# Patient Record
Sex: Female | Born: 1965
Health system: Southern US, Community
[De-identification: ages and names within clinical notes are randomized; demographics above are authoritative.]

## PROBLEM LIST (undated history)

## (undated) DIAGNOSIS — I10 Essential (primary) hypertension: Secondary | ICD-10-CM

## (undated) DIAGNOSIS — E669 Obesity, unspecified: Secondary | ICD-10-CM

## (undated) DIAGNOSIS — G4730 Sleep apnea, unspecified: Secondary | ICD-10-CM

## (undated) HISTORY — DX: Obesity, unspecified: E66.9

## (undated) HISTORY — DX: Sleep apnea, unspecified: G47.30

---

## 2001-07-27 ENCOUNTER — Emergency Department (HOSPITAL_COMMUNITY): Admission: EM | Admit: 2001-07-27 | Discharge: 2001-07-27 | Payer: Self-pay | Admitting: Emergency Medicine

## 2001-07-30 ENCOUNTER — Encounter: Payer: Self-pay | Admitting: Family Medicine

## 2001-07-30 ENCOUNTER — Ambulatory Visit (HOSPITAL_COMMUNITY): Admission: RE | Admit: 2001-07-30 | Discharge: 2001-07-30 | Payer: Self-pay | Admitting: Family Medicine

## 2001-08-06 ENCOUNTER — Other Ambulatory Visit: Admission: RE | Admit: 2001-08-06 | Discharge: 2001-08-06 | Payer: Self-pay | Admitting: Obstetrics and Gynecology

## 2003-08-02 ENCOUNTER — Emergency Department (HOSPITAL_COMMUNITY): Admission: EM | Admit: 2003-08-02 | Discharge: 2003-08-02 | Payer: Self-pay | Admitting: Emergency Medicine

## 2003-08-02 ENCOUNTER — Encounter: Payer: Self-pay | Admitting: Emergency Medicine

## 2005-08-11 ENCOUNTER — Emergency Department (HOSPITAL_COMMUNITY): Admission: EM | Admit: 2005-08-11 | Discharge: 2005-08-11 | Payer: Self-pay | Admitting: Family Medicine

## 2009-02-26 ENCOUNTER — Encounter: Admission: RE | Admit: 2009-02-26 | Discharge: 2009-02-26 | Payer: Self-pay | Admitting: Family Medicine

## 2010-11-09 ENCOUNTER — Encounter: Payer: Self-pay | Admitting: Family Medicine

## 2012-12-07 ENCOUNTER — Emergency Department (INDEPENDENT_AMBULATORY_CARE_PROVIDER_SITE_OTHER): Payer: BC Managed Care – PPO

## 2012-12-07 ENCOUNTER — Encounter (HOSPITAL_COMMUNITY): Payer: Self-pay | Admitting: *Deleted

## 2012-12-07 ENCOUNTER — Emergency Department (HOSPITAL_COMMUNITY)
Admission: EM | Admit: 2012-12-07 | Discharge: 2012-12-07 | Disposition: A | Discharge status: Home / Self Care | Payer: BC Managed Care – PPO | Source: Home / Self Care | Attending: Family Medicine | Admitting: Family Medicine

## 2012-12-07 DIAGNOSIS — M7552 Bursitis of left shoulder: Secondary | ICD-10-CM

## 2012-12-07 DIAGNOSIS — M719 Bursopathy, unspecified: Secondary | ICD-10-CM

## 2012-12-07 DIAGNOSIS — M67919 Unspecified disorder of synovium and tendon, unspecified shoulder: Secondary | ICD-10-CM

## 2012-12-07 MED ORDER — DICLOFENAC POTASSIUM 50 MG PO TABS: 50.0000 mg | ORAL_TABLET | Freq: Three times a day (TID) | ORAL | Status: DC

## 2012-12-07 NOTE — ED Notes (Signed)
C/o L  Shoulder pain onset 1 week ago.  No known injury.  Noted swelling L upper arm 3 days ago.  Feels  pulling in the L side of her neck.  Has pain in her L breast Friday night.

## 2012-12-07 NOTE — ED Provider Notes (Signed)
History     CSN: [id number]  Arrival date & time 12/07/12  1453   First MD Initiated Contact with Patient 12/07/12 1505      Chief Complaint  Patient presents with  . Shoulder Pain    (Consider location/radiation/quality/duration/timing/severity/associated sxs/prior treatment) Patient is a 47 y.o. female presenting with shoulder pain. The history is provided by the patient.  Shoulder Pain This is a new problem. The current episode started more than 1 week ago. The problem has been gradually worsening.    History reviewed. No pertinent past medical history.  History reviewed. No pertinent past surgical history.  Family History  Problem Relation Age of Onset  . Hypertension Mother   . Heart disease Mother   . Cirrhosis Father   . Breast cancer Other     History  Substance Use Topics  . Smoking status: Never Smoker   . Smokeless tobacco: Not on file  . Alcohol Use: Yes     Comment: occasional    OB History   Grav Para Term Preterm Abortions TAB SAB Ect Mult Living                  Review of Systems  Constitutional: Negative.   Musculoskeletal: Positive for joint swelling.  Skin: Negative.     Allergies  Review of patient's allergies indicates no known allergies.  Home Medications   Current Outpatient Rx  Name  Route  Sig  Dispense  Refill  . diclofenac (CATAFLAM) 50 MG tablet   Oral   Take 1 tablet (50 mg total) by mouth 3 (three) times daily.   30 tablet   0     BP 154/75  Pulse 93  Temp(Src) 97.8 F (36.6 C) (Oral)  Resp 16  SpO2 100%  Physical Exam  Nursing note and vitals reviewed. Constitutional: She is oriented to person, place, and time. She appears well-developed and well-nourished.  Musculoskeletal: She exhibits tenderness.       Left shoulder: She exhibits tenderness and pain. She exhibits no swelling, no effusion, no deformity, no spasm, normal pulse and normal strength.  Neurological: She is alert and oriented to person, place,  and time.  Skin: Skin is warm and dry.    ED Course  Procedures (including critical care time)  Labs Reviewed - No data to display Dg Shoulder Left  12/07/2012  *RADIOLOGY REPORT*  Clinical Data: Pain.  No injury.  LEFT SHOULDER - 2+ VIEW  Comparison: None  Findings: Mild degenerative changes in the left AC joint. Glenohumeral joint is intact. No acute bony abnormality. Specifically, no fracture, subluxation, or dislocation.  Soft tissues are intact.  IMPRESSION: No acute bony abnormality.   Original Report Authenticated By: Charlett Nose, M.D.      1. Bursitis of deltoid, left       MDM  X-rays reviewed and report per radiologist.         Linna Hoff, MD 12/07/12 2010

## 2013-07-06 ENCOUNTER — Ambulatory Visit (INDEPENDENT_AMBULATORY_CARE_PROVIDER_SITE_OTHER): Payer: Self-pay | Admitting: General Surgery

## 2013-07-18 ENCOUNTER — Encounter (INDEPENDENT_AMBULATORY_CARE_PROVIDER_SITE_OTHER): Payer: Self-pay | Admitting: General Surgery

## 2014-01-02 ENCOUNTER — Other Ambulatory Visit (HOSPITAL_COMMUNITY): Payer: Self-pay | Admitting: Nurse Practitioner

## 2014-01-02 DIAGNOSIS — Z1231 Encounter for screening mammogram for malignant neoplasm of breast: Secondary | ICD-10-CM

## 2014-01-06 ENCOUNTER — Ambulatory Visit (HOSPITAL_COMMUNITY): Payer: BC Managed Care – PPO

## 2014-01-17 ENCOUNTER — Ambulatory Visit (HOSPITAL_COMMUNITY)
Admission: RE | Admit: 2014-01-17 | Discharge: 2014-01-17 | Disposition: A | Discharge status: Home / Self Care | Payer: BC Managed Care – PPO | Source: Ambulatory Visit | Attending: Nurse Practitioner | Admitting: Nurse Practitioner

## 2014-01-17 DIAGNOSIS — Z1231 Encounter for screening mammogram for malignant neoplasm of breast: Secondary | ICD-10-CM | POA: Insufficient documentation

## 2014-01-19 ENCOUNTER — Other Ambulatory Visit: Payer: Self-pay | Admitting: Nurse Practitioner

## 2014-01-19 DIAGNOSIS — R928 Other abnormal and inconclusive findings on diagnostic imaging of breast: Secondary | ICD-10-CM

## 2014-01-31 ENCOUNTER — Ambulatory Visit
Admission: RE | Admit: 2014-01-31 | Discharge: 2014-01-31 | Disposition: A | Discharge status: Home / Self Care | Payer: BC Managed Care – PPO | Source: Ambulatory Visit | Attending: Nurse Practitioner | Admitting: Nurse Practitioner

## 2014-01-31 DIAGNOSIS — R928 Other abnormal and inconclusive findings on diagnostic imaging of breast: Secondary | ICD-10-CM

## 2014-06-30 ENCOUNTER — Telehealth (HOSPITAL_COMMUNITY): Payer: Self-pay | Admitting: *Deleted

## 2014-06-30 NOTE — Telephone Encounter (Signed)
Telephoned patient at home # and left message to return call to [facility] 

## 2014-08-17 ENCOUNTER — Other Ambulatory Visit (HOSPITAL_COMMUNITY): Payer: Self-pay | Admitting: *Deleted

## 2014-08-17 DIAGNOSIS — Z09 Encounter for follow-up examination after completed treatment for conditions other than malignant neoplasm: Secondary | ICD-10-CM

## 2014-09-07 ENCOUNTER — Ambulatory Visit (HOSPITAL_COMMUNITY): Payer: BC Managed Care – PPO

## 2014-10-11 ENCOUNTER — Encounter (HOSPITAL_COMMUNITY): Payer: Self-pay | Admitting: Emergency Medicine

## 2014-10-11 ENCOUNTER — Emergency Department (HOSPITAL_COMMUNITY)
Admission: EM | Admit: 2014-10-11 | Discharge: 2014-10-11 | Disposition: A | Discharge status: Home / Self Care | Payer: BC Managed Care – PPO | Attending: Emergency Medicine | Admitting: Emergency Medicine

## 2014-10-11 ENCOUNTER — Emergency Department (HOSPITAL_COMMUNITY): Payer: BC Managed Care – PPO

## 2014-10-11 DIAGNOSIS — M1711 Unilateral primary osteoarthritis, right knee: Secondary | ICD-10-CM | POA: Insufficient documentation

## 2014-10-11 DIAGNOSIS — S8991XA Unspecified injury of right lower leg, initial encounter: Secondary | ICD-10-CM | POA: Insufficient documentation

## 2014-10-11 DIAGNOSIS — Y9289 Other specified places as the place of occurrence of the external cause: Secondary | ICD-10-CM | POA: Insufficient documentation

## 2014-10-11 DIAGNOSIS — M25561 Pain in right knee: Secondary | ICD-10-CM

## 2014-10-11 DIAGNOSIS — W19XXXA Unspecified fall, initial encounter: Secondary | ICD-10-CM

## 2014-10-11 DIAGNOSIS — Y9301 Activity, walking, marching and hiking: Secondary | ICD-10-CM | POA: Insufficient documentation

## 2014-10-11 DIAGNOSIS — W108XXA Fall (on) (from) other stairs and steps, initial encounter: Secondary | ICD-10-CM | POA: Insufficient documentation

## 2014-10-11 DIAGNOSIS — Z791 Long term (current) use of non-steroidal anti-inflammatories (NSAID): Secondary | ICD-10-CM | POA: Insufficient documentation

## 2014-10-11 DIAGNOSIS — Y998 Other external cause status: Secondary | ICD-10-CM | POA: Insufficient documentation

## 2014-10-11 MED ORDER — NAPROXEN 500 MG PO TABS: 500.0000 mg | ORAL_TABLET | Freq: Two times a day (BID) | ORAL | Status: DC

## 2014-10-11 MED ORDER — HYDROCODONE-ACETAMINOPHEN 5-325 MG PO TABS: 1.0000 | ORAL_TABLET | Freq: Four times a day (QID) | ORAL | Status: DC | PRN

## 2014-10-11 MED ORDER — TRAMADOL HCL 50 MG PO TABS
50.0000 mg | ORAL_TABLET | Freq: Once | ORAL | Status: AC
  Administered 2014-10-11: 50 mg via ORAL
  Filled 2014-10-11: qty 1

## 2014-10-11 NOTE — ED Notes (Signed)
PA at bedside.

## 2014-10-11 NOTE — ED Notes (Signed)
Pt states that she missed the last step on stairs yesterday and fell. Now c/o rt knee pain.

## 2014-10-11 NOTE — ED Provider Notes (Signed)
CSN: [id number]     Arrival date & time 10/11/14  1154 History  This chart was scribed for non-physician practitioner, Eben Burowourtney A Forcucci, PA-C, working with Purvis SheffieldForrest Harrison, MD by Charline BillsEssence Howell, ED Scribe. This patient was seen in room WTR9/WTR9 and the patient's care was started at 12:32 PM.   Chief Complaint  Patient presents with  . Fall  . Knee Pain   The history is provided by the patient. No language interpreter was used.   HPI Comments: Megan Wallace is a 48 y.o. female, with no pertinent medical history, who presents to the Emergency Department complaining of fall that occurred yesterday. Pt reports that she was walking down the stairs when she missed the last step and landed on her R knee. She reports secondary joint swelling and R knee pain that she describes as a stabbing sensation. Pt currently rates her pain 8/10. Pain is exacerbated with walking. She denies LOC, hitting her head, fever, chills, nausea, vomiting. No h/o seizures. She has tried ice, hot pack and Aspirin without relief.   History reviewed. No pertinent past medical history. No past surgical history on file. Family History  Problem Relation Age of Onset  . Hypertension Mother   . Heart disease Mother   . Cirrhosis Father   . Breast cancer Other    History  Substance Use Topics  . Smoking status: Never Smoker   . Smokeless tobacco: Not on file  . Alcohol Use: Yes     Comment: occasional   OB History    No data available     Review of Systems  Constitutional: Negative for fever and chills.  Gastrointestinal: Negative for nausea and vomiting.  Musculoskeletal: Positive for joint swelling and arthralgias.  Neurological: Negative for syncope.  All other systems reviewed and are negative.  Allergies  Review of patient's allergies indicates no known allergies.  Home Medications   Prior to Admission medications   Medication Sig Start Date End Date Taking? Authorizing Provider  diclofenac  (CATAFLAM) 50 MG tablet Take 1 tablet (50 mg total) by mouth 3 (three) times daily. 12/07/12   Linna HoffJames D Kindl, MD  HYDROcodone-acetaminophen (NORCO/VICODIN) 5-325 MG per tablet Take 1 tablet by mouth every 6 (six) hours as needed for moderate pain or severe pain. 10/11/14   Maisie Hauser A Forcucci, PA-C  naproxen (NAPROSYN) 500 MG tablet Take 1 tablet (500 mg total) by mouth 2 (two) times daily. 10/11/14   Liliya Fullenwider A Forcucci, PA-C   Triage Vitals: BP 134/81 mmHg  Pulse 65  Temp(Src) 98.4 F (36.9 C) (Oral)  Resp 18  SpO2 100% Physical Exam  Constitutional: She is oriented to person, place, and time. She appears well-developed and well-nourished. No distress.  HENT:  Head: Normocephalic and atraumatic.  Eyes: Conjunctivae and EOM are normal.  Neck: Neck supple. No tracheal deviation present.  Cardiovascular: Normal rate and regular rhythm.  Exam reveals no gallop and no friction rub.   No murmur heard. Pulses:      Dorsalis pedis pulses are 1+ on the right side.       Posterior tibial pulses are 1+ on the right side.  Pulmonary/Chest: Effort normal. No respiratory distress. She has no wheezes. She has no rhonchi. She has no rales.  Musculoskeletal: Normal range of motion.       Right knee: She exhibits bony tenderness. She exhibits normal range of motion, no swelling, no effusion, no ecchymosis, no deformity, no laceration, no erythema, normal alignment, no LCL laxity, normal patellar  mobility, normal meniscus and no MCL laxity. No tenderness found. No medial joint line, no lateral joint line, no MCL, no LCL and no patellar tendon tenderness noted.  Negative anterior and posterior drawer. No laxity with valgus or varus stress testing. Plain maximal tenderness over the patella. Knee exam difficult to perform due to morbid obesity.  Neurological: She is alert and oriented to person, place, and time.  Skin: Skin is warm and dry.  Psychiatric: She has a normal mood and affect. Her behavior is  normal.  Nursing note and vitals reviewed.  ED Course  Procedures (including critical care time) DIAGNOSTIC STUDIES: Oxygen Saturation is 100% on RA, normal by my interpretation.    COORDINATION OF CARE: 12:37 PM-Discussed treatment plan which includes XR with pt at bedside and pt agreed to plan.   Labs Review Labs Reviewed - No data to display  Imaging Review Dg Knee Complete 4 Views Right  10/11/2014   CLINICAL DATA:  Right knee pain after fall. Missed a step, heard a pop.  EXAM: RIGHT KNEE - COMPLETE 4+ VIEW  COMPARISON:  Right knee 02/26/2009  FINDINGS: No acute fracture or dislocation. There is moderate-to-severe tricompartmental osteoarthritis, progressed in the patellofemoral and medial tibial femoral compartment from prior. There is a small joint effusion.  IMPRESSION: Progressive tricompartmental osteoarthritis without acute fracture. Small joint effusion.   Electronically Signed   By: Rubye OaksMelanie  Ehinger M.D.   On: 10/11/2014 12:42     EKG Interpretation None      MDM   Final diagnoses:  Fall, initial encounter  Right knee pain  Primary osteoarthritis of right knee   Patient is a 48 year old female who presents emergency room for evaluation of right knee pain after a fall. Physical exam reveals neurovascularly intact right leg. There is tenderness palpation over the patella. There is no visible effusion or swelling. Plain film x-rays negative for acute fractures. Patient does have tricompartmental x-ray evidence of arthritis. There is no evidence for ligamentous injury on exam. We'll discharge patient home with hydrocodone and naproxen. Patient follow-up with her PCP [name]. Patient states understanding and agreement at this time. Patient is stable for discharge.  I personally performed the services described in this documentation, which was scribed in my presence. The recorded information has been reviewed and is accurate.    Eben Burowourtney A Forcucci, PA-C 10/11/14  1303  Purvis SheffieldForrest Harrison, MD 10/11/14 [phone]

## 2014-10-11 NOTE — Discharge Instructions (Signed)
Knee Pain °The knee is the complex joint between your thigh and your lower leg. It is made up of bones, tendons, ligaments, and cartilage. The bones that make up the knee are: °· The femur in the thigh. °· The tibia and fibula in the lower leg. °· The patella or kneecap riding in the groove on the lower femur. °CAUSES  °Knee pain is a common complaint with many causes. A few of these causes are: °· Injury, such as: °· A ruptured ligament or tendon injury. °· Torn cartilage. °· Medical conditions, such as: °· Gout °· Arthritis °· Infections °· Overuse, over training, or overdoing a physical activity. °Knee pain can be minor or severe. Knee pain can accompany debilitating injury. Minor knee problems often respond well to self-care measures or get well on their own. More serious injuries may need medical intervention or even surgery. °SYMPTOMS °The knee is complex. Symptoms of knee problems can vary widely. Some of the problems are: °· Pain with movement and weight bearing. °· Swelling and tenderness. °· Buckling of the knee. °· Inability to straighten or extend your knee. °· Your knee locks and you cannot straighten it. °· Warmth and redness with pain and fever. °· Deformity or dislocation of the kneecap. °DIAGNOSIS  °Determining what is wrong may be very straight forward such as when there is an injury. It can also be challenging because of the complexity of the knee. Tests to make a diagnosis may include: °· Your caregiver taking a history and doing a physical exam. °· Routine X-rays can be used to rule out other problems. X-rays will not reveal a cartilage tear. Some injuries of the knee can be diagnosed by: °¨ Arthroscopy a surgical technique by which a small video camera is inserted through tiny incisions on the sides of the knee. This procedure is used to examine and repair internal knee joint problems. Tiny instruments can be used during arthroscopy to repair the torn knee cartilage (meniscus). °¨ Arthrography  is a radiology technique. A contrast liquid is directly injected into the knee joint. Internal structures of the knee joint then become visible on X-ray film. °¨ An MRI scan is a non X-ray radiology procedure in which magnetic fields and a computer produce two- or three-dimensional images of the inside of the knee. Cartilage tears are often visible using an MRI scanner. MRI scans have largely replaced arthrography in diagnosing cartilage tears of the knee. °· Blood work. °· Examination of the fluid that helps to lubricate the knee joint (synovial fluid). This is done by taking a sample out using a needle and a syringe. °TREATMENT °The treatment of knee problems depends on the cause. Some of these treatments are: °· Depending on the injury, proper casting, splinting, surgery, or physical therapy care will be needed. °· Give yourself adequate recovery time. Do not overuse your joints. If you begin to get sore during workout routines, back off. Slow down or do fewer repetitions. °· For repetitive activities such as cycling or running, maintain your strength and nutrition. °· Alternate muscle groups. For example, if you are a weight lifter, work the upper body on one day and the lower body the next. °· Either tight or weak muscles do not give the proper support for your knee. Tight or weak muscles do not absorb the stress placed on the knee joint. Keep the muscles surrounding the knee strong. °· Take care of mechanical problems. °¨ If you have flat feet, orthotics or special shoes may help.   See your caregiver if you need help. °¨ Arch supports, sometimes with wedges on the inner or outer aspect of the heel, can help. These can shift pressure away from the side of the knee most bothered by osteoarthritis. °¨ A brace called an "unloader" brace also may be used to help ease the pressure on the most arthritic side of the knee. °· If your caregiver has prescribed crutches, braces, wraps or ice, use as directed. The acronym  for this is PRICE. This means protection, rest, ice, compression, and elevation. °· Nonsteroidal anti-inflammatory drugs (NSAIDs), can help relieve pain. But if taken immediately after an injury, they may actually increase swelling. Take NSAIDs with food in your stomach. Stop them if you develop stomach problems. Do not take these if you have a history of ulcers, stomach pain, or bleeding from the bowel. Do not take without your caregiver's approval if you have problems with fluid retention, heart failure, or kidney problems. °· For ongoing knee problems, physical therapy may be helpful. °· Glucosamine and chondroitin are over-the-counter dietary supplements. Both may help relieve the pain of osteoarthritis in the knee. These medicines are different from the usual anti-inflammatory drugs. Glucosamine may decrease the rate of cartilage destruction. °· Injections of a corticosteroid drug into your knee joint may help reduce the symptoms of an arthritis flare-up. They may provide pain relief that lasts a few months. You may have to wait a few months between injections. The injections do have a small increased risk of infection, water retention, and elevated blood sugar levels. °· Hyaluronic acid injected into damaged joints may ease pain and provide lubrication. These injections may work by reducing inflammation. A series of shots may give relief for as long as 6 months. °· Topical painkillers. Applying certain ointments to your skin may help relieve the pain and stiffness of osteoarthritis. Ask your pharmacist for suggestions. Many over the-counter products are approved for temporary relief of arthritis pain. °· In some countries, doctors often prescribe topical NSAIDs for relief of chronic conditions such as arthritis and tendinitis. A review of treatment with NSAID creams found that they worked as well as oral medications but without the serious side effects. °PREVENTION °· Maintain a healthy weight. Extra pounds  put more strain on your joints. °· Get strong, stay limber. Weak muscles are a common cause of knee injuries. Stretching is important. Include flexibility exercises in your workouts. °· Be smart about exercise. If you have osteoarthritis, chronic knee pain or recurring injuries, you may need to change the way you exercise. This does not mean you have to stop being active. If your knees ache after jogging or playing basketball, consider switching to swimming, water aerobics, or other low-impact activities, at least for a few days a week. Sometimes limiting high-impact activities will provide relief. °· Make sure your shoes fit well. Choose footwear that is right for your sport. °· Protect your knees. Use the proper gear for knee-sensitive activities. Use kneepads when playing volleyball or laying carpet. Buckle your seat belt every time you drive. Most shattered kneecaps occur in car accidents. °· Rest when you are tired. °SEEK MEDICAL CARE IF:  °You have knee pain that is continual and does not seem to be getting better.  °SEEK IMMEDIATE MEDICAL CARE IF:  °Your knee joint feels hot to the touch and you have a high fever. °MAKE SURE YOU:  °· Understand these instructions. °· Will watch your condition. °· Will get help right away if you are not   doing well or get worse. Document Released: 08/03/2007 Document Revised: 12/29/2011 Document Reviewed: 08/03/2007 [facility] Patient Information 2015 [place], [place]. This information is not intended to replace advice given to you by your health care provider. Make sure you discuss any questions you have with your health care provider.   Arthritis, Nonspecific Arthritis is pain, redness, warmth, or puffiness (inflammation) of a joint. The joint may be stiff or hurt when you move it. One or more joints may be affected. There are many types of arthritis. Your doctor may not know what type you have right away. The most common cause of arthritis is wear and tear on the joint  (osteoarthritis). HOME CARE   Only take medicine as told by your doctor.  Rest the joint as much as possible.  Raise (elevate) your joint if it is puffy.  Use crutches if the painful joint is in your leg.  Drink enough fluids to keep your pee (urine) clear or pale yellow.  Follow your doctor's diet instructions.  Use cold packs for very bad joint pain for 10 to 15 minutes every hour. Ask your doctor if it is okay for you to use hot packs.  Exercise as told by your doctor.  Take a warm shower if you have stiffness in the morning.  Move your sore joints throughout the day. GET HELP RIGHT AWAY IF:   You have a fever.  You have very bad joint pain, puffiness, or redness.  You have many joints that are painful and puffy.  You are not getting better with treatment.  You have very bad back pain or leg weakness.  You cannot control when you poop (bowel movement) or pee (urinate).  You do not feel better in 24 hours or are getting worse.  You are having side effects from your medicine. MAKE SURE YOU:   Understand these instructions.  Will watch your condition.  Will get help right away if you are not doing well or get worse. Document Released: 12/31/2009 Document Revised: 04/06/2012 Document Reviewed: 12/31/2009 [facility] Patient Information 2015 [place], [place]. This information is not intended to replace advice given to you by your health care provider. Make sure you discuss any questions you have with your health care provider.   Emergency Department Resource Guide 1) Find a Doctor and Pay Out of Pocket Although you won't have to find out who is covered by your insurance plan, it is a good idea to ask around and get recommendations. You will then need to call the office and see if the doctor you have chosen will accept you as a new patient and what types of options they offer for patients who are self-pay. Some doctors offer discounts or will set up payment plans for  their patients who do not have insurance, but you will need to ask so you aren't surprised when you get to your appointment.  2) Contact Your Local Health Department Not all health departments have doctors that can see patients for sick visits, but many do, so it is worth a call to see if yours does. If you don't know where your local health department is, you can check in your phone book. The CDC also has a tool to help you locate your state's health department, and many state websites also have listings of all of their local health departments.  3) Find a Walk-in Clinic If your illness is not likely to be very severe or complicated, you may want to try a walk in clinic.  These are popping up all over the country in pharmacies, drugstores, and shopping centers. They're usually staffed by nurse practitioners or physician assistants that have been trained to treat common illnesses and complaints. They're usually fairly quick and inexpensive. However, if you have serious medical issues or chronic medical problems, these are probably not your best option.  No Primary Care Doctor: - Call Health Connect at  [phone] - they can help you locate a primary care doctor that  accepts your insurance, provides certain services, etc. - Physician Referral Service- [phone]  Chronic Pain Problems: Organization         Address  Phone   Notes  [facility]  [phone] Patients need to be referred by their primary care doctor.   Medication Assistance: Organization         Address  Phone   Notes  [facility] Medication [facility] [address] [place], [place] [address] [phone] --Must be a resident of [facility] -- Must have NO insurance coverage whatsoever (no Medicaid/ Medicare, etc.) -- The pt. MUST have a primary care doctor that directs their care regularly and follows them in the community   [facility]  [phone]   [employer]  [phone]    Agencies that provide inexpensive medical care: Organization         Address  Phone   Notes  [facility]  [phone]   [facility]    [phone]   [facility] [address] [place], [place] [address] [phone]   [facility] [address] [place] [phone]   Planned Parenthood    [phone]   [facility]    [phone]   [facility]  [address], [place] Phone:  [phone], Fax:  [phone] Hours of Operation:  9 am - 6 pm, M-F.  Also accepts Medicaid/Medicare and self-pay.  [facility]  [address], [place] Phone: [phone], Fax: [phone]. Hours of Operation:  8:30 am - 5:30 pm, M-F.  Also accepts Medicaid and self-pay.  [facility] [place] [address] [place] Phone: [phone]   [facility] [address] Natasha Bence [place], [place] [phone], Ext. [phone] Mondays & Thursdays: 7-9 AM.  First 15 patients are seen on a first come, first serve basis.    Medicaid-accepting [facility] [address] [facility] [address] [facility] Providers:  Organization         Address  Phone   Notes  [facility] [address], [place] [phone] Also accepts self-pay patients.  [facility] [address] Laurell Josephs [place], [place]  [phone]   [facility] [address], [place] [phone]   [facility] [address], [place] [phone]   Renaye Rakers [address], [place]   [phone] Only accepts [place] patients after they have their name applied to their card.   Self-Pay (no insurance) in [facility]:  Organization         Address  Phone   Notes  Sickle Cell Patients, [facility] [address] [place], [place] [phone]   [facility] [address] [place], [place] [phone]   [facility]  [place]  [address],  [phone]   [facility]/Dr. Osei-Bonsu  [address] [place] [address], [place] or [address], [place] [phone] Phone number for both [place] and [place] locations is the same.  [facility] [address], [place] [phone]) [phone]   [facility] [place] [address] [place] [address], [place] or [address] [phone]   [facility] [address], [place] [phone]) [phone], phone; [phone]) [phone], fax Sees patients 1st and 3rd Saturday of every month.  Must not qualify for public or private insurance (i.e. Medicaid, Medicare, [place] Health Choice, Veterans' Benefits)  Household income should be no more than 200% of the poverty level The clinic cannot treat you if you are pregnant or think you are pregnant  Sexually transmitted diseases are not treated at the clinic.    Dental Care: Organization         Address  Phone  Notes  [facility] [address], [place] [phone]) [phone] Accepts children up to age 821 who are enrolled in [place] or [place] Health Choice; pregnant women with a Medicaid card; and children who have applied for Medicaid or [place] Health Choice, but were declined, whose parents can pay a reduced fee at time of service.  [facility] [place]  [address], [place] [phone] Accepts children up to age 50 who are enrolled in [place] or [place] Health Choice; pregnant women with a Medicaid card; and children who have applied for Medicaid or [place] Health Choice, but were declined, whose parents can pay a reduced fee at time of service.  [facility]  [address], [place] [phone] Patients are  seen by appointment only. Walk-ins are not accepted. [facility] will see patients 50 years of age and older. Monday - Tuesday (8am-5pm) Most Wednesdays (8:30-5pm) $30 per visit, cash only  [facility]  [address], [facility] [phone] Patients are seen by appointment only. Walk-ins are not accepted. [facility] will see patients 50 years of age and older. One Wednesday Evening (Monthly: Volunteer Based).  $30 per visit, cash only  [employer] [facility]  [phone]) [phone] for adults; Children under age 724, call Graduate Pediatric Dentistry at [phone]. Children aged 254-14, please call [phone]) [phone] to request a pediatric application.  Dental services are provided in all areas of dental care including fillings, crowns and bridges, complete and partial dentures, implants, gum treatment, root canals, and extractions. Preventive care is also provided. Treatment is provided to both adults and children. Patients are selected via a lottery and there is often a waiting list.   [facility] [address], [place]  [phone]) [phone] [email or web address]   [facility] [address], [place], [place] [phone], Ext. [phone] Second and Fourth Thursday of each month, opens at 6:30 AM; Clinic ends at 9 AM.  Patients are seen on a first-come first-served basis, and a limited number are seen during each clinic.   [facility]  [address], [place], [place] [phone]) [phone]   Eligibility Requirements You must have lived in [place], [place], or [place] counties for at least the last three months.   You cannot be eligible for state or federal sponsored National Cityhealthcare insurance, including CIGNAVeterans Administration,  Medicaid, or Medicare.   You generally cannot be eligible for healthcare insurance through your employer.    How to apply: Eligibility screenings are held every Tuesday and Wednesday afternoon from 1:00 pm until 4:00  pm. You do not need an appointment for the interview!  [facility] [address], [place], [place] [phone]   [facility]  [phone]   [facility]  [phone]   [facility]  [phone]    Behavioral Health Resources in the Community: Intensive Outpatient Programs Organization         Address  Phone  Notes  [facility] [address], [place], [place] [phone]   [facility] [address], [place], [place] [phone]   [facility] [address], [place], [place]  [phone]   [facility] [address],  [place], [place] [phone] or [phone]   Substance Abuse Resources Organization         Address  Phone  Notes  [facility]  [phone]   [facility]  [phone]   [facility]  [phone]   [facility]  [phone]   Residential & Outpatient Substance Abuse Program  [phone]   Psychological Services Organization         Address  Phone  Notes  [facility]  [phone]- [phone]   [facility]  [phone]- [phone]   [facility] [address], [place] [phone] or [phone]    Mobile Crisis Teams Organization         Address  Phone  Notes  [facility], Mobile Crisis Care Unit  [phone]   [facility]  [address] [place], [place] [phone]   Doristine LocksSharon DeEsch [address] [place] [phone]    Self-Help/Support Groups Organization         Address  Phone             Notes  [facility] [place] - variety of support groups  [phone]- [phone] Call for more information  Narcotics Anonymous (NA), [facility] [address], [place]  2 meetings at this location   Statistician[facility]idential Treatment Programs Organization          Address  Phone  Notes  [facility] [address],    [place]  [phone]   [facility]  [address], [place] [phone], [place], [place] [phone]   [facility] [address], [place] [phone] Admissions: 8am-3pm M-F  [facility] [address]    [place], [place] [phone]   [facility] [address], [place], [place] [phone]   [facility] [address]  [place], [place] [phone]   [facility] - Intensive Outpatient [address], [place], [place] [phone]   [facility] [address]  [place], [place] [phone] or [phone]   [facility] ([facility]) [address] [place], [place] [phone] Accepts Medicaid  [facility] [address]  [place] [phone] Substance Abuse/Addiction Treatment   [facility] Resources Organization         Address  Phone  Notes  [facility]  [phone]   Angie FavaJulie Brannon, PhD [address] [place], [place]   [phone] or [phone]) [phone]  [facility]   [address] [place], [place] [phone]   [address], [place], [place] [phone] Insurance/Medicaid/sponsorship through [facility] and Families [address], Ste [place], [place] [phone] [place], [place] [phone]    Dr. Adele Schilder  [phone]   [facility] 1) [address], [place] 2) [place] 3)  [address], [place] [phone]  [phone]   [place] [phone] or [phone] (After Hours)

## 2014-10-18 ENCOUNTER — Encounter (HOSPITAL_COMMUNITY): Payer: Self-pay

## 2014-10-18 ENCOUNTER — Ambulatory Visit (HOSPITAL_COMMUNITY)
Admission: RE | Admit: 2014-10-18 | Discharge: 2014-10-18 | Disposition: A | Discharge status: Home / Self Care | Payer: BC Managed Care – PPO | Source: Ambulatory Visit | Attending: Obstetrics and Gynecology | Admitting: Obstetrics and Gynecology

## 2014-10-18 VITALS — BP 146/84 | Temp 98.2°F | Ht 66.0 in | Wt 379.0 lb

## 2014-10-18 DIAGNOSIS — Z1239 Encounter for other screening for malignant neoplasm of breast: Secondary | ICD-10-CM

## 2014-10-18 NOTE — Patient Instructions (Signed)
Explained to Megan Wallace that she did not need a Pap smear today due to last Pap smear was in February 2015 per patient. Let her know [facility] will cover Pap smears every 3 years unless has a history of abnormal Pap smears. [facility] diagnostic mamm[facility] per recommendation. Appointment scheduled for Thursday, October 19, 2014 at 0815. Patient aware of appointment and will be there. Norlene CampbellNatroy R Hannon verbalized understanding.  Carisa Backhaus, Kathaleen Maserhristine Poll, RN 9:12 AM

## 2014-10-18 NOTE — Progress Notes (Signed)
Complaints of left breast pain that comes and goes. Patient rated pain at a 1 out of 10. [facility] due to recommendation of 6 month follow-up bilateral diagnostic mammogram. Last diagnostic mammogram and bilateral breast ultrasounds [facility].  Pap Smear:  Pap smear not completed today. Last [facility] was in [facility] 2015 at [facility] and normal per patient. Per patient has no history of an abnormal Pap smear. No Pap smear results in EPIC.  Physical exam: Breasts Breasts symmetrical. No skin abnormalities bilateral breasts. No nipple retraction bilateral breasts. No nipple discharge bilateral breasts. No lymphadenopathy. No lumps palpated bilateral breasts. No complaints of pain or tenderness on exam. [facility] for a bilateral diagnostic mammogram per recommendation. Appointment scheduled for Thursday, October 19, 2014 at 0815.      Pelvic/Bimanual No Pap smear completed today since last Pap smear was February 2015. Pap smear not indicated per BCCCP guidelines.

## 2014-10-19 ENCOUNTER — Ambulatory Visit
Admission: RE | Admit: 2014-10-19 | Discharge: 2014-10-19 | Disposition: A | Discharge status: Home / Self Care | Payer: No Typology Code available for payment source | Source: Ambulatory Visit | Attending: Obstetrics and Gynecology | Admitting: Obstetrics and Gynecology

## 2014-10-19 DIAGNOSIS — Z09 Encounter for follow-up examination after completed treatment for conditions other than malignant neoplasm: Secondary | ICD-10-CM

## 2014-10-26 ENCOUNTER — Ambulatory Visit: Payer: No Typology Code available for payment source

## 2014-10-26 ENCOUNTER — Ambulatory Visit: Payer: BC Managed Care – PPO

## 2015-05-30 ENCOUNTER — Other Ambulatory Visit: Payer: Self-pay | Admitting: Obstetrics and Gynecology

## 2015-05-30 DIAGNOSIS — N6489 Other specified disorders of breast: Secondary | ICD-10-CM

## 2015-06-08 ENCOUNTER — Ambulatory Visit
Admission: RE | Admit: 2015-06-08 | Discharge: 2015-06-08 | Disposition: A | Discharge status: Home / Self Care | Payer: No Typology Code available for payment source | Source: Ambulatory Visit | Attending: Obstetrics and Gynecology | Admitting: Obstetrics and Gynecology

## 2015-06-08 DIAGNOSIS — N6489 Other specified disorders of breast: Secondary | ICD-10-CM

## 2016-12-03 ENCOUNTER — Other Ambulatory Visit (HOSPITAL_COMMUNITY): Payer: Self-pay | Admitting: *Deleted

## 2016-12-03 DIAGNOSIS — M79622 Pain in left upper arm: Secondary | ICD-10-CM

## 2016-12-25 ENCOUNTER — Ambulatory Visit
Admission: RE | Admit: 2016-12-25 | Discharge: 2016-12-25 | Disposition: A | Discharge status: Home / Self Care | Payer: No Typology Code available for payment source | Source: Ambulatory Visit | Attending: Obstetrics and Gynecology | Admitting: Obstetrics and Gynecology

## 2016-12-25 ENCOUNTER — Ambulatory Visit (HOSPITAL_COMMUNITY)
Admission: RE | Admit: 2016-12-25 | Discharge: 2016-12-25 | Disposition: A | Discharge status: Home / Self Care | Payer: Self-pay | Source: Ambulatory Visit | Attending: Obstetrics and Gynecology | Admitting: Obstetrics and Gynecology

## 2016-12-25 ENCOUNTER — Encounter (HOSPITAL_COMMUNITY): Payer: Self-pay | Admitting: *Deleted

## 2016-12-25 VITALS — BP 120/80 | Temp 98.5°F | Ht 66.0 in | Wt 380.6 lb

## 2016-12-25 DIAGNOSIS — Z01419 Encounter for gynecological examination (general) (routine) without abnormal findings: Secondary | ICD-10-CM

## 2016-12-25 DIAGNOSIS — M79622 Pain in left upper arm: Secondary | ICD-10-CM

## 2016-12-25 NOTE — Patient Instructions (Signed)
Explained breast self awareness with Megan CampbellNatroy R Wallace. Let patient know [facility]CP will cover Pap smears and HPV typing every 5 years unless has a history of abnormal Pap smears. [facility] for a [facility] mamm[facility]. Appointment scheduled for Thursday, December 25, 2016 at 0950. Let patient know will follow up with her within the next couple weeks with results of Pap smear by phone. Megan CampbellNatroy R Wallace verbalized understanding.  Lonnel Gjerde, Kathaleen Maserhristine Poll, RN 10:05 AM

## 2016-12-25 NOTE — Progress Notes (Signed)
Complaints of left outer and axillary pain and occasional pain around the nipple area x 2 months that comes and goes. Patient rates the pain at a 2 out of 10.  Pap Smear: Pap smear completed today. [facility] and normal per patient. Per patient has no history of an abnormal Pap smear. No Pap smear results are in EPIC.  Physical exam: Breasts Breasts symmetrical. No skin abnormalities bilateral breasts. No nipple retraction bilateral breasts. No nipple discharge bilateral breasts. No lymphadenopathy. No lumps palpated bilateral breasts. No complaints of pain or tenderness on exam. [facility] for a diagnostic mamm[facility]. Appointment scheduled for Thursday, December 25, 2016 at 0950.  Pelvic/Bimanual   Ext Genitalia No lesions, no swelling and no discharge observed on external genitalia.         Vagina Vagina pink and normal texture. No lesions or discharge observed in vagina.          Cervix Cervix is present. Cervix pink and of normal texture. No discharge observed.     Uterus Uterus is present and palpable. Uterus in normal position and normal size.        Adnexae Bilateral ovaries present and palpable. No tenderness on palpation.          Rectovaginal No rectal exam completed today since patient had no rectal complaints. No skin abnormalities observed on exam.    Smoking History: Patient has never smoked.  Patient Navigation: Patient education provided. Access to services provided for patient through BCCCP program.   Colorectal Cancer Screening: Per patient has never had a colonoscopy completed. No complaints today. FIT Test given to patient to complete and return to [facility]CP.

## 2016-12-26 ENCOUNTER — Encounter (HOSPITAL_COMMUNITY): Payer: Self-pay | Admitting: *Deleted

## 2016-12-27 ENCOUNTER — Other Ambulatory Visit: Payer: Self-pay

## 2016-12-29 LAB — CYTOLOGY - PAP
DIAGNOSIS: NEGATIVE
HPV: NOT DETECTED

## 2017-01-03 LAB — FECAL OCCULT BLOOD, IMMUNOCHEMICAL: FECAL OCCULT BLD: NEGATIVE

## 2017-01-09 ENCOUNTER — Encounter (HOSPITAL_COMMUNITY): Payer: Self-pay | Admitting: *Deleted

## 2017-01-09 ENCOUNTER — Telehealth (HOSPITAL_COMMUNITY): Payer: Self-pay | Admitting: *Deleted

## 2017-01-09 NOTE — Telephone Encounter (Signed)
Telephoned patient at home number and left message to return call [facility]. Need to give results to Fit Test and pap smear.

## 2017-01-21 ENCOUNTER — Telehealth (HOSPITAL_COMMUNITY): Payer: Self-pay | Admitting: *Deleted

## 2017-01-21 NOTE — Telephone Encounter (Signed)
Telephoned patient at home number and advised patient of negative pap smear results. HPV was negative. Advised patient Fit Test was negative. Next pap smear due in five years. Patient voiced understanding.

## 2017-01-23 ENCOUNTER — Encounter (HOSPITAL_COMMUNITY): Payer: Self-pay | Admitting: *Deleted

## 2018-08-31 ENCOUNTER — Other Ambulatory Visit: Payer: Self-pay | Admitting: Obstetrics and Gynecology

## 2018-08-31 DIAGNOSIS — Z1231 Encounter for screening mammogram for malignant neoplasm of breast: Secondary | ICD-10-CM

## 2018-10-18 ENCOUNTER — Ambulatory Visit
Admission: RE | Admit: 2018-10-18 | Discharge: 2018-10-18 | Disposition: A | Discharge status: Home / Self Care | Payer: Commercial Managed Care - PPO | Source: Ambulatory Visit | Attending: Obstetrics and Gynecology | Admitting: Obstetrics and Gynecology

## 2018-10-18 DIAGNOSIS — Z1231 Encounter for screening mammogram for malignant neoplasm of breast: Secondary | ICD-10-CM

## 2019-06-15 ENCOUNTER — Ambulatory Visit
Admission: EM | Admit: 2019-06-15 | Discharge: 2019-06-15 | Disposition: A | Discharge status: Home / Self Care | Payer: Commercial Managed Care - PPO | Attending: Physician Assistant | Admitting: Physician Assistant

## 2019-06-15 DIAGNOSIS — M545 Low back pain, unspecified: Secondary | ICD-10-CM

## 2019-06-15 MED ORDER — KETOROLAC TROMETHAMINE 30 MG/ML IJ SOLN
30.0000 mg | Freq: Once | INTRAMUSCULAR | Status: AC
  Administered 2019-06-15: 30 mg via INTRAMUSCULAR

## 2019-06-15 MED ORDER — METHOCARBAMOL 500 MG PO TABS: 500.0000 mg | ORAL_TABLET | Freq: Two times a day (BID) | ORAL | 0 refills | Status: DC

## 2019-06-15 MED ORDER — PREDNISONE 50 MG PO TABS: 50.0000 mg | ORAL_TABLET | Freq: Every day | ORAL | 0 refills | Status: DC

## 2019-06-15 NOTE — Discharge Instructions (Signed)
Toradol injection in office today. Start prednisone as directed. Robaxin as needed, this can make you drowsy, so do not take if you are going to drive, operate heavy machinery, or make important decisions. Ice/heat compresses as needed. This can take up to 3-4 weeks to completely resolve, but you should be feeling better each week. Follow up with PCP if symptoms worsen, changes for reevaluation. If experience numbness/tingling of the inner thighs, loss of bladder or bowel control, go to the emergency department for evaluation.

## 2019-06-15 NOTE — ED Triage Notes (Signed)
Pt c/o lower back pain radiating down lt leg since last Saturday. Denies injury

## 2019-06-15 NOTE — ED Provider Notes (Signed)
[facility]    CSN: [id number] Arrival date & time: 06/15/19  1154      History   Chief Complaint Chief Complaint  Patient presents with  . Back Pain    HPI Megan Wallace is a 53 y.o. female.   53 year old female comes in for 5-day history of left-sided back pain.  Denies injury/trauma.  States pain to the left lumbar region that can radiate down the left leg.  Pain is constant, worse with movement.  Denies saddle anesthesia, loss of bladder or bowel control.  She denies urinary frequency, dysuria, hematuria.  She states since pain started, has also felt more constipated.  She took a laxative, which helped with bowel movement, but did not relieve any back pain.  She denies any increase in activity.  Worked as require twisting and turning of the back pocket, but does not involve heavy lifting.  She has been taking ibuprofen 800 mg every 4-6 hours with mild temporary relief.      History reviewed. No pertinent past medical history.  There are no active problems to display for this patient.   History reviewed. No pertinent surgical history.  OB History    Gravida  3   Para  1   Term  1   Preterm      AB  2   Living  1     SAB      TAB  2   Ectopic      Multiple      Live Births               Home Medications    Prior to Admission medications   Medication Sig Start Date End Date Taking? Authorizing Provider  methocarbamol (ROBAXIN) 500 MG tablet Take 1 tablet (500 mg total) by mouth 2 (two) times daily. 06/15/19   Ok Edwards, PA-C  predniSONE (DELTASONE) 50 MG tablet Take 1 tablet (50 mg total) by mouth daily with breakfast. 06/15/19   Ok Edwards, PA-C    Family History Family History  Problem Relation Age of Onset  . Hypertension Mother   . Heart disease Mother   . Cirrhosis Father   . Breast cancer Other   . Breast cancer Maternal Grandmother   . Breast cancer Maternal Aunt     Social History Social History   Tobacco Use   . Smoking status: Never Smoker  . Smokeless tobacco: Never Used  Substance Use Topics  . Alcohol use: Yes    Comment: occasional  . Drug use: No     Allergies   Patient has no known allergies.   Review of Systems Review of Systems  Reason unable to perform ROS: See HPI as above.     Physical Exam Triage Vital Signs ED Triage Vitals  Enc Vitals Group     BP 06/15/19 1202 (!) 169/105     Pulse Rate 06/15/19 1202 81     Resp 06/15/19 1202 18     Temp 06/15/19 1202 98.5 F (36.9 C)     Temp Source 06/15/19 1202 Oral     SpO2 06/15/19 1202 96 %     Weight --      Height --      Head Circumference --      Peak Flow --      Pain Score 06/15/19 1203 7     Pain Loc --      Pain Edu? --      Excl.  in GC? --    No data found.  Updated Vital Signs BP (!) 169/105 (BP Location: Left Arm)   Pulse 81   Temp 98.5 F (36.9 C) (Oral)   Resp 18   SpO2 96%   Physical Exam Constitutional:      General: She is not in acute distress.    Appearance: She is well-developed. She is not diaphoretic.  HENT:     Head: Normocephalic and atraumatic.  Eyes:     Conjunctiva/sclera: Conjunctivae normal.     Pupils: Pupils are equal, round, and reactive to light.  Cardiovascular:     Rate and Rhythm: Normal rate and regular rhythm.     Heart sounds: Normal heart sounds. No murmur. No friction rub. No gallop.   Pulmonary:     Effort: Pulmonary effort is normal. No accessory muscle usage or respiratory distress.     Breath sounds: Normal breath sounds. No stridor. No decreased breath sounds, wheezing, rhonchi or rales.  Musculoskeletal:     Comments: No rashes seen. No tenderness on palpation of the spinous processes. Tenderness to palpation of left lumbar back. Full range of motion of back.  Full passive range of motion of hip.  Strength deferred due to back pain. Sensation intact and equal bilaterally.  Negative straight leg raise.  Skin:    General: Skin is warm and dry.   Neurological:     Mental Status: She is alert and oriented to person, place, and time.      UC Treatments / Results  Labs (all labs ordered are listed, but only abnormal results are displayed) Labs Reviewed - No data to display  EKG   Radiology No results found.  Procedures Procedures (including critical care time)  Medications Ordered in UC Medications  ketorolac (TORADOL) 30 MG/ML injection 30 mg (30 mg Intramuscular Given 06/15/19 1230)    Initial Impression / Assessment and Plan / UC Course  I have reviewed the triage vital signs and the nursing notes.  Pertinent labs & imaging results that were available during my care of the patient were reviewed by me and considered in my medical decision making (see chart for details).    Toradol injection in office today.  Will start pressure and home prednisone, Robaxin as needed.  Return precautions given.  Patient expresses understanding and agrees to plan.  Final Clinical Impressions(s) / UC Diagnoses   Final diagnoses:  Acute left-sided low back pain without sciatica   ED Prescriptions    Medication Sig Dispense Auth. Provider   predniSONE (DELTASONE) 50 MG tablet Take 1 tablet (50 mg total) by mouth daily with breakfast. 5 tablet ,  V, PA-C   methocarbamol (ROBAXIN) 500 MG tablet Take 1 tablet (500 mg total) by mouth 2 (two) times daily. 20 tablet Threasa Alpha,  V, PA-C        ,  V, [place] 06/15/19 1305

## 2019-06-16 ENCOUNTER — Telehealth: Payer: Self-pay

## 2019-07-05 ENCOUNTER — Ambulatory Visit: Payer: Commercial Managed Care - PPO

## 2019-07-06 ENCOUNTER — Telehealth: Payer: Self-pay

## 2019-07-06 NOTE — Telephone Encounter (Signed)
Called patient to do their pre-visit COVID screening.  Call went to voicemail. Unable to do prescreening.  

## 2019-07-07 ENCOUNTER — Ambulatory Visit (INDEPENDENT_AMBULATORY_CARE_PROVIDER_SITE_OTHER): Payer: Commercial Managed Care - PPO | Admitting: Family Medicine

## 2019-07-07 ENCOUNTER — Other Ambulatory Visit: Payer: Self-pay

## 2019-07-07 VITALS — BP 166/85 | HR 73 | Temp 97.2°F | Resp 17 | Ht 66.0 in | Wt 360.8 lb

## 2019-07-07 DIAGNOSIS — R1032 Left lower quadrant pain: Secondary | ICD-10-CM

## 2019-07-07 DIAGNOSIS — K5909 Other constipation: Secondary | ICD-10-CM

## 2019-07-07 DIAGNOSIS — Z1211 Encounter for screening for malignant neoplasm of colon: Secondary | ICD-10-CM

## 2019-07-07 DIAGNOSIS — Z13228 Encounter for screening for other metabolic disorders: Secondary | ICD-10-CM

## 2019-07-07 DIAGNOSIS — N3001 Acute cystitis with hematuria: Secondary | ICD-10-CM

## 2019-07-07 DIAGNOSIS — Z1159 Encounter for screening for other viral diseases: Secondary | ICD-10-CM

## 2019-07-07 DIAGNOSIS — I10 Essential (primary) hypertension: Secondary | ICD-10-CM

## 2019-07-07 LAB — POCT URINALYSIS DIP (CLINITEK)
Bilirubin, UA: NEGATIVE
Glucose, UA: NEGATIVE mg/dL
Ketones, POC UA: NEGATIVE mg/dL
Nitrite, UA: NEGATIVE
POC PROTEIN,UA: NEGATIVE
Spec Grav, UA: 1.02 (ref 1.010–1.025)
Urobilinogen, UA: 0.2 E.U./dL
pH, UA: 7 (ref 5.0–8.0)

## 2019-07-07 MED ORDER — POLYETHYLENE GLYCOL 3350 17 GM/SCOOP PO POWD: 17.0000 g | Freq: Every day | ORAL | 1 refills | Status: DC

## 2019-07-07 MED ORDER — LISINOPRIL-HYDROCHLOROTHIAZIDE 10-12.5 MG PO TABS: 1.0000 | ORAL_TABLET | Freq: Every day | ORAL | 3 refills | Status: DC

## 2019-07-07 MED ORDER — CEPHALEXIN 500 MG PO CAPS: 500.0000 mg | ORAL_CAPSULE | Freq: Two times a day (BID) | ORAL | 0 refills | Status: DC

## 2019-07-07 NOTE — Progress Notes (Signed)
States that the lower back pain has resolved.  Is having sharp stabbing left sided abdominal pain. Pain worsens with movement. Also c/o urinary frequency & scanty urination.

## 2019-07-07 NOTE — Progress Notes (Signed)
Subjective:  Patient ID: Megan Wallace, female    DOB: 11-Apr-1966  Age: 53 y.o. MRN: [id number]  CC: Establish Care and Follow-up   HPI Megan Wallace is a 53 year old obese female who presents today to get established. She complains of left-sided abdominal pain which initially started as left-sided low back pain radiating down her left lower extremity for which she was seen at urgent care on 06/15/2019 and treated with Toradol and Robaxin with some improvement in her symptoms.  She had describes this as back spasms and now rates the symptoms as 3/10. She also endorses the urge to urinate but with production of little amounts of urine.  Denies urinary frequency, fever.  Endorses constipation and moves her bowels every 2 days but denies presence of hematochezia, nausea vomiting.  Her blood pressure is elevated today at 166/85 and was elevated at 169/105 at her UC visit with no diagnosis of hypertension in the past. With regards to her weight she has been working on a keto diet and has lost over 10 pounds so far.  History reviewed. No pertinent past medical history.  History reviewed. No pertinent surgical history.  Family History  Problem Relation Age of Onset  . Hypertension Mother   . Heart disease Mother   . Cirrhosis Father   . Breast cancer Other   . Breast cancer Maternal Grandmother   . Breast cancer Maternal Aunt     No Known Allergies  Outpatient Medications Prior to Visit  Medication Sig Dispense Refill  . methocarbamol (ROBAXIN) 500 MG tablet Take 1 tablet (500 mg total) by mouth 2 (two) times daily. 20 tablet 0  . predniSONE (DELTASONE) 50 MG tablet Take 1 tablet (50 mg total) by mouth daily with breakfast. 5 tablet 0   No facility-administered medications prior to visit.      ROS Review of Systems  Constitutional: Negative for activity change, appetite change and fatigue.  HENT: Negative for congestion, sinus pressure and sore throat.   Eyes: Negative  for visual disturbance.  Respiratory: Negative for cough, chest tightness, shortness of breath and wheezing.   Cardiovascular: Negative for chest pain and palpitations.  Gastrointestinal: Positive for abdominal pain. Negative for abdominal distention and constipation.  Endocrine: Negative for polydipsia.  Genitourinary: Negative for dysuria and frequency.  Musculoskeletal: Negative for arthralgias and back pain.  Skin: Negative for rash.  Neurological: Negative for tremors, light-headedness and numbness.  Hematological: Does not bruise/bleed easily.  Psychiatric/Behavioral: Negative for agitation and behavioral problems.    Objective:  BP (!) 166/85   Pulse 73   Temp (!) 97.2 F (36.2 C) (Temporal)   Resp 17   Ht 5' 6"  (1.676 m)   Wt (!) 360 lb 12.8 oz (163.7 kg)   SpO2 93%   BMI 58.23 kg/m   BP/Weight 07/07/2019 11/16/7865 03/26/2093  Systolic BP 709 628 366  Diastolic BP 85 294 80  Wt. (Lbs) 360.8 - 380.6  BMI 58.23 - 61.43      Physical Exam Constitutional:      Appearance: She is well-developed.  Cardiovascular:     Rate and Rhythm: Normal rate.     Heart sounds: Normal heart sounds. No murmur.  Pulmonary:     Effort: Pulmonary effort is normal.     Breath sounds: Normal breath sounds. No wheezing or rales.  Chest:     Chest wall: No tenderness.  Abdominal:     General: Bowel sounds are normal. There is no distension.  Palpations: Abdomen is soft. There is no mass.     Tenderness: There is abdominal tenderness (LLQ TTP and on ROM).  Musculoskeletal: Normal range of motion.  Neurological:     Mental Status: She is alert and oriented to person, place, and time.       Assessment & Plan:   1. Essential hypertension New diagnosis Counseled on blood pressure goal of less than 130/80, low-sodium, DASH diet, medication compliance, 150 minutes of moderate intensity exercise per week. Discussed medication compliance, adverse effects. - CMP14+EGFR - Lipid  panel - lisinopril-hydrochlorothiazide (ZESTORETIC) 10-12.5 MG tablet; Take 1 tablet by mouth daily.  Dispense: 30 tablet; Refill: 3  2. Acute cystitis with hematuria - POCT URINALYSIS DIP (CLINITEK) - cephALEXin (KEFLEX) 500 MG capsule; Take 1 capsule (500 mg total) by mouth 2 (two) times daily.  Dispense: 14 capsule; Refill: 0  3. Abdominal pain, left lower quadrant Could be muscle spasm We will treat for constipation as well Refill Robaxin  4. Screening for metabolic disorder - Hemoglobin A1c  5. Screening for colon cancer Referred for colonoscopy  6. Screening for viral disease - Hepatitis c antibody (reflex) - HIV Antibody (routine testing w rflx)  7. Morbid obesity ([place]) Counseled on 150 minutes of exercise per week, healthy eating (including decreased daily intake of saturated fats, cholesterol, added sugars, sodium)  8. Other constipation Increase fiber intake - polyethylene glycol powder (GLYCOLAX/MIRALAX) 17 GM/SCOOP powder; Take 17 g by mouth daily.  Dispense: 3350 g; Refill: 1    Meds ordered this encounter  Medications  . lisinopril-hydrochlorothiazide (ZESTORETIC) 10-12.5 MG tablet    Sig: Take 1 tablet by mouth daily.    Dispense:  30 tablet    Refill:  3  . polyethylene glycol powder (GLYCOLAX/MIRALAX) 17 GM/SCOOP powder    Sig: Take 17 g by mouth daily.    Dispense:  3350 g    Refill:  1  . cephALEXin (KEFLEX) 500 MG capsule    Sig: Take 1 capsule (500 mg total) by mouth 2 (two) times daily.    Dispense:  14 capsule    Refill:  0    Follow-up: Return in about 1 month (around 08/06/2019) for follow up of HTN and abdominal pain.       Charlott Rakes, MD, FAAFP. [facility] [place], [place]   07/07/2019, 11:34 AM

## 2019-07-08 LAB — CMP14+EGFR
ALT: 23 IU/L (ref 0–32)
AST: 16 IU/L (ref 0–40)
Albumin/Globulin Ratio: 1.6 (ref 1.2–2.2)
Albumin: 4.3 g/dL (ref 3.8–4.9)
Alkaline Phosphatase: 90 IU/L (ref 39–117)
BUN/Creatinine Ratio: 20 (ref 9–23)
BUN: 13 mg/dL (ref 6–24)
Bilirubin Total: 0.2 mg/dL (ref 0.0–1.2)
CO2: 22 mmol/L (ref 20–29)
Calcium: 9.4 mg/dL (ref 8.7–10.2)
Chloride: 103 mmol/L (ref 96–106)
Creatinine, Ser: 0.64 mg/dL (ref 0.57–1.00)
GFR calc Af Amer: 116 mL/min/{1.73_m2} (ref >59)
GFR calc non Af Amer: 101 mL/min/{1.73_m2} (ref >59)
Globulin, Total: 2.7 g/dL (ref 1.5–4.5)
Glucose: 100 mg/dL — ABNORMAL HIGH (ref 65–99)
Potassium: 4.4 mmol/L (ref 3.5–5.2)
Sodium: 140 mmol/L (ref 134–144)
Total Protein: 7 g/dL (ref 6.0–8.5)

## 2019-07-08 LAB — HCV COMMENT:

## 2019-07-08 LAB — LIPID PANEL
Chol/HDL Ratio: 2.5 ratio (ref 0.0–4.4)
Cholesterol, Total: 205 mg/dL — ABNORMAL HIGH (ref 100–199)
HDL: 81 mg/dL (ref >39)
LDL Chol Calc (NIH): 114 mg/dL — ABNORMAL HIGH (ref 0–99)
Triglycerides: 54 mg/dL (ref 0–149)
VLDL Cholesterol Cal: 10 mg/dL (ref 5–40)

## 2019-07-08 LAB — HIV ANTIBODY (ROUTINE TESTING W REFLEX): HIV Screen 4th Generation wRfx: NONREACTIVE

## 2019-07-08 LAB — HEMOGLOBIN A1C
Est. average glucose Bld gHb Est-mCnc: 103 mg/dL
Hgb A1c MFr Bld: 5.2 % (ref 4.8–5.6)

## 2019-07-08 LAB — HEPATITIS C ANTIBODY (REFLEX): HCV Ab: <0.1 s/co ratio (ref 0.0–0.9)

## 2019-07-08 MED ORDER — METHOCARBAMOL 500 MG PO TABS: 500.0000 mg | ORAL_TABLET | Freq: Two times a day (BID) | ORAL | 0 refills | Status: DC

## 2019-07-18 ENCOUNTER — Telehealth: Payer: Self-pay | Admitting: General Practice

## 2019-07-18 MED ORDER — FLUCONAZOLE 150 MG PO TABS: 150.0000 mg | ORAL_TABLET | Freq: Once | ORAL | 0 refills | Status: DC

## 2019-07-18 NOTE — Telephone Encounter (Signed)
I have sent a prescription to the pharmacy for her. 

## 2019-07-18 NOTE — Telephone Encounter (Signed)
Patient says prescription has given her a yeast infection. Wants to know if you could write her something for that as it is very uncomfortable.

## 2019-07-20 NOTE — Telephone Encounter (Signed)
Patient called again this morning, asking for yeast infection prescription. I told her you had sent a prescription over. She then called the pharmacy and they said they did not have anything for her. I told her I'd give her a call once you've sent something.

## 2019-07-21 ENCOUNTER — Other Ambulatory Visit: Payer: Self-pay | Admitting: Family Medicine

## 2019-07-21 MED ORDER — FLUCONAZOLE 150 MG PO TABS: 150.0000 mg | ORAL_TABLET | Freq: Once | ORAL | 0 refills | Status: DC

## 2019-07-21 MED ORDER — FLUCONAZOLE 150 MG PO TABS: 150.0000 mg | ORAL_TABLET | Freq: Once | ORAL | 0 refills | Status: AC

## 2019-07-21 NOTE — Telephone Encounter (Signed)
On 9/28 I sent Diflucan to [facility] on [address] and have resent it again today.

## 2019-08-09 ENCOUNTER — Telehealth: Payer: Self-pay

## 2019-08-09 NOTE — Telephone Encounter (Signed)

## 2019-08-10 ENCOUNTER — Ambulatory Visit (INDEPENDENT_AMBULATORY_CARE_PROVIDER_SITE_OTHER): Payer: Commercial Managed Care - PPO | Admitting: Nurse Practitioner

## 2019-08-10 ENCOUNTER — Other Ambulatory Visit: Payer: Self-pay

## 2019-08-10 VITALS — BP 132/82 | HR 85 | Temp 97.3°F | Resp 17 | Wt 356.0 lb

## 2019-08-10 DIAGNOSIS — R109 Unspecified abdominal pain: Secondary | ICD-10-CM | POA: Diagnosis not present

## 2019-08-10 DIAGNOSIS — I10 Essential (primary) hypertension: Secondary | ICD-10-CM | POA: Diagnosis not present

## 2019-08-10 DIAGNOSIS — Z6841 Body Mass Index (BMI) 40.0 and over, adult: Secondary | ICD-10-CM

## 2019-08-10 DIAGNOSIS — E559 Vitamin D deficiency, unspecified: Secondary | ICD-10-CM

## 2019-08-10 DIAGNOSIS — Z23 Encounter for immunization: Secondary | ICD-10-CM | POA: Diagnosis not present

## 2019-08-10 DIAGNOSIS — Z1231 Encounter for screening mammogram for malignant neoplasm of breast: Secondary | ICD-10-CM

## 2019-08-10 NOTE — Progress Notes (Signed)
Checks BP occasionally at home. Denies chest pain, SHOB, headaches, dizziness, palpitations, lower extremity swelling.

## 2019-08-10 NOTE — Patient Instructions (Signed)
   Sent Referral to [facility] Ph# [phone] #[phone] [phone] .

## 2019-08-10 NOTE — Progress Notes (Signed)
Assessment & Plan:  Lasean was seen today for hypertension and abdominal pain.  Diagnoses and all orders for this visit:  Essential hypertension Continue all antihypertensives as prescribed.  Remember to bring in your blood pressure log with you for your follow up appointment.  DASH/Mediterranean Diets are healthier choices for HTN.    Vitamin D deficiency -     VITAMIN D 25 Hydroxy (Vit-D Deficiency, Fractures)  Morbid obesity with BMI of 50.0-59.9, adult (HCC) -     Amb Referral to Bariatric Surgery Discussed diet and exercise for person with BMI >55. Instructed: You must burn more calories than you eat. Losing 5 percent of your body weight should be considered a success. In the longer term, losing more than 15 percent of your body weight and staying at this weight is an extremely good result. However, keep in mind that even losing 5 percent of your body weight leads to important health benefits, so try not to get discouraged if you're not able to lose more than this. Will recheck weight in 3-6 months.  Breast cancer screening by mammogram -     MM 3D SCREEN BREAST BILATERAL; Future  Need for Tdap vaccination -     Tdap vaccine greater than or equal to 7yo IM  Needs flu shot    Patient has been counseled on age-appropriate routine health concerns for screening and prevention. These are reviewed and up-to-date. Referrals have been placed accordingly. Immunizations are up-to-date or declined.    Subjective:   Chief Complaint  Patient presents with  . Hypertension  . Abdominal Pain   HPI Megan Wallace 53 y.o. female presents to office today for follow up to HTN.  She was referred to gastroenterology in September however reports that she still has not heard from them for scheduling of colonoscopy.  She was given the phone number for [facility] today to go ahead and schedule.   Essential Hypertension Blood pressure is well controlled today.  She endorses medication  compliance taking Zestoretic 10-12.5 mg daily.  She had her friend who is a CNA check her blood pressure 1 day with readings 128/83. She does not monitor her own blood pressure at home. Denies chest pain, shortness of breath, palpitations, lightheadedness, dizziness, headaches or BLE edema.  BP Readings from Last 3 Encounters:  08/10/19 132/82  07/07/19 (!) 166/85  06/15/19 (!) 169/105   Morbid obesity  She has been trying out the keto diet but foods are mostly high in fat and salt. Although has helped with her weight loss it is not healthy for her HTN or cholesterol. She is receptive to being referred to the bariatric center for weight loss recommendations.   Review of Systems  Constitutional: Negative for fever, malaise/fatigue and weight loss.  HENT: Negative.  Negative for nosebleeds.   Eyes: Negative.  Negative for blurred vision, double vision and photophobia.  Respiratory: Negative.  Negative for cough and shortness of breath.   Cardiovascular: Negative.  Negative for chest pain, palpitations and leg swelling.  Gastrointestinal: Negative.  Negative for heartburn, nausea and vomiting.  Musculoskeletal: Negative.  Negative for myalgias.  Neurological: Negative.  Negative for dizziness, focal weakness, seizures and headaches.  Psychiatric/Behavioral: Negative.  Negative for suicidal ideas.    No past medical history on file.  No past surgical history on file.  Family History  Problem Relation Age of Onset  . Hypertension Mother   . Heart disease Mother   . Cirrhosis Father   . Breast  cancer Other   . Breast cancer Maternal Grandmother   . Breast cancer Maternal Aunt     Social History Reviewed with no changes to be made today.   Outpatient Medications Prior to Visit  Medication Sig Dispense Refill  . lisinopril-hydrochlorothiazide (ZESTORETIC) 10-12.5 MG tablet Take 1 tablet by mouth daily. 30 tablet 3  . polyethylene glycol powder (GLYCOLAX/MIRALAX) 17 GM/SCOOP powder  Take 17 g by mouth daily. 3350 g 1   No facility-administered medications prior to visit.     No Known Allergies     Objective:    BP 132/82   Pulse 85   Temp (!) 97.3 F (36.3 C) (Temporal)   Resp 17   Wt (!) 356 lb (161.5 kg)   SpO2 95%   BMI 57.46 kg/m  Wt Readings from Last 3 Encounters:  08/10/19 (!) 356 lb (161.5 kg)  07/07/19 (!) 360 lb 12.8 oz (163.7 kg)  12/25/16 (!) 380 lb 9.6 oz (172.6 kg)    Physical Exam Vitals signs and nursing note reviewed.  Constitutional:      Appearance: She is well-developed. She is obese.  HENT:     Head: Normocephalic and atraumatic.  Neck:     Musculoskeletal: Normal range of motion.  Cardiovascular:     Rate and Rhythm: Normal rate and regular rhythm.     Heart sounds: Normal heart sounds. No murmur. No friction rub. No gallop.   Pulmonary:     Effort: Pulmonary effort is normal. No tachypnea or respiratory distress.     Breath sounds: Normal breath sounds. No decreased breath sounds, wheezing, rhonchi or rales.  Chest:     Chest wall: No tenderness.  Abdominal:     General: Bowel sounds are normal.     Palpations: Abdomen is soft.  Musculoskeletal: Normal range of motion.  Skin:    General: Skin is warm and dry.  Neurological:     Mental Status: She is alert and oriented to person, place, and time.     Coordination: Coordination normal.  Psychiatric:        Behavior: Behavior normal. Behavior is cooperative.        Thought Content: Thought content normal.        Judgment: Judgment normal.          Patient has been counseled extensively about nutrition and exercise as well as the importance of adherence with medications and regular follow-up. The patient was given clear instructions to go to ER or return to medical center if symptoms don't improve, worsen or new problems develop. The patient verbalized understanding.   Follow-up: Return in about 2 weeks (around 08/24/2019).   Gildardo Pounds, FNP-BC [facility] [place], [place]   08/10/2019, 3:56 PM

## 2019-08-11 ENCOUNTER — Ambulatory Visit: Payer: Commercial Managed Care - PPO

## 2019-08-11 LAB — VITAMIN D 25 HYDROXY (VIT D DEFICIENCY, FRACTURES): Vit D, 25-Hydroxy: 35.4 ng/mL (ref 30.0–100.0)

## 2019-08-30 ENCOUNTER — Telehealth: Payer: Self-pay | Admitting: General Practice

## 2019-08-30 NOTE — Telephone Encounter (Signed)
1) Medication(s) Requested (by name): -lisinopril-hydrochlorothiazide (ZESTORETIC) 10-12.5 MG tablet   2) Pharmacy of Choice: -[id number] - [place], [place]

## 2019-08-30 NOTE — Telephone Encounter (Signed)
Prescription was sent 07/07/2019, #30 with 3 RFs.  Patient needs to call pharmacy & enter the Rx number on her bottle.

## 2019-08-31 ENCOUNTER — Ambulatory Visit: Payer: Commercial Managed Care - PPO

## 2019-10-24 ENCOUNTER — Ambulatory Visit
Admission: RE | Admit: 2019-10-24 | Discharge: 2019-10-24 | Disposition: A | Discharge status: Home / Self Care | Payer: Commercial Managed Care - PPO | Source: Ambulatory Visit | Attending: Nurse Practitioner | Admitting: Nurse Practitioner

## 2019-10-24 ENCOUNTER — Other Ambulatory Visit: Payer: Self-pay

## 2019-10-24 DIAGNOSIS — Z1231 Encounter for screening mammogram for malignant neoplasm of breast: Secondary | ICD-10-CM

## 2019-11-08 ENCOUNTER — Other Ambulatory Visit: Payer: Self-pay | Admitting: Family Medicine

## 2019-11-08 DIAGNOSIS — I10 Essential (primary) hypertension: Secondary | ICD-10-CM

## 2019-11-18 ENCOUNTER — Ambulatory Visit
Admission: EM | Admit: 2019-11-18 | Discharge: 2019-11-18 | Disposition: A | Discharge status: Home / Self Care | Payer: Commercial Managed Care - PPO | Attending: Emergency Medicine | Admitting: Emergency Medicine

## 2019-11-18 DIAGNOSIS — I10 Essential (primary) hypertension: Secondary | ICD-10-CM | POA: Diagnosis not present

## 2019-11-18 DIAGNOSIS — Z20822 Contact with and (suspected) exposure to covid-19: Secondary | ICD-10-CM | POA: Diagnosis not present

## 2019-11-18 DIAGNOSIS — J069 Acute upper respiratory infection, unspecified: Secondary | ICD-10-CM | POA: Diagnosis not present

## 2019-11-18 HISTORY — DX: Essential (primary) hypertension: I10

## 2019-11-18 MED ORDER — FLUCONAZOLE 200 MG PO TABS: 200.0000 mg | ORAL_TABLET | Freq: Once | ORAL | 0 refills | Status: AC

## 2019-11-18 MED ORDER — AZITHROMYCIN 250 MG PO TABS: 250.0000 mg | ORAL_TABLET | Freq: Every day | ORAL | 0 refills | Status: DC

## 2019-11-18 MED ORDER — ALBUTEROL SULFATE HFA 108 (90 BASE) MCG/ACT IN AERS: 2.0000 | INHALATION_SPRAY | RESPIRATORY_TRACT | 0 refills | Status: DC | PRN

## 2019-11-18 MED ORDER — BENZONATATE 100 MG PO CAPS: 100.0000 mg | ORAL_CAPSULE | Freq: Three times a day (TID) | ORAL | 0 refills | Status: DC

## 2019-11-18 MED ORDER — AEROCHAMBER PLUS FLO-VU MEDIUM MISC: 1.0000 | Freq: Once | 0 refills | Status: AC

## 2019-11-18 NOTE — ED Triage Notes (Signed)
Pt c/o cough and nasal congestion x 4-5 days. States having coughing spells to where she vomits

## 2019-11-18 NOTE — Discharge Instructions (Signed)
Your COVID test is pending - it is important to quarantine / isolate at home until your results are back. °If you test positive and would like further evaluation for persistent or worsening symptoms, you may schedule an E-visit or virtual (video) visit throughout the [facility] MyChart app or website. ° °PLEASE NOTE: If you develop severe chest pain or shortness of breath please go to the ER or call 9-1-1 for further evaluation --> DO NOT schedule electronic or virtual visits for this. °Please call our office for further guidance / recommendations as needed. ° °For information about the Covid vaccine, please visit [email or web address] °

## 2019-11-18 NOTE — ED Provider Notes (Signed)
[facility]    CSN: [id number] Arrival date & time: 11/18/19  1630      History   Chief Complaint Chief Complaint  Patient presents with  . Cough    HPI Megan Wallace is a 54 y.o. female with history of hypertension, obesity presenting for cough, nasal congestion for the last 4 to 5 days.  Patient has had increased sputum production which is clear, without blood.  No wheezing, difficulty breathing, chest pain, lightheadedness, fever.  No known Covid exposures.  Has been using OTC [place], sinus pills, NyQuil with moderate relief of symptoms.  She does note that she will have coughing fits to the point where she vomits: Patient states that it is not food products, though clear liquid (likely secretory).  Patient denies smoking, asthma though admits she used to use an inhaler years ago.  No lower extremity edema, nausea, vomiting, abdominal pain.    Past Medical History:  Diagnosis Date  . Hypertension     There are no problems to display for this patient.   History reviewed. No pertinent surgical history.  OB History    Gravida  3   Para  1   Term  1   Preterm      AB  2   Living  1     SAB      TAB  2   Ectopic      Multiple      Live Births               Home Medications    Prior to Admission medications   Medication Sig Start Date End Date Taking? Authorizing Provider  albuterol (VENTOLIN HFA) 108 (90 Base) MCG/ACT inhaler Inhale 2 puffs into the lungs every 4 (four) hours as needed for wheezing or shortness of breath. 11/18/19   Hall-Potvin, [place], PA-C  azithromycin (ZITHROMAX) 250 MG tablet Take 1 tablet (250 mg total) by mouth daily. Take first 2 tablets together, then 1 every day until finished. 11/18/19   Hall-Potvin, [place], PA-C  benzonatate (TESSALON) 100 MG capsule Take 1 capsule (100 mg total) by mouth every 8 (eight) hours. 11/18/19   Hall-Potvin, [place], PA-C  fluconazole (DIFLUCAN) 200 MG tablet Take 1 tablet  (200 mg total) by mouth once for 1 dose. May repeat in 72 hours if needed 11/18/19 11/18/19  Hall-Potvin, [place], PA-C  lisinopril-hydrochlorothiazide (ZESTORETIC) 10-12.5 MG tablet TAKE 1 TABLET BY MOUTH EVERY DAY 11/08/19   Hoy Register, MD  Spacer/Aero-Holding Chambers (AEROCHAMBER PLUS FLO-VU MEDIUM) MISC 1 each by Other route once for 1 dose. 11/18/19 11/18/19  Hall-Potvin, [place], PA-C    Family History Family History  Problem Relation Age of Onset  . Hypertension Mother   . Heart disease Mother   . Cirrhosis Father   . Breast cancer Other   . Breast cancer Maternal Grandmother   . Breast cancer Maternal Aunt     Social History Social History   Tobacco Use  . Smoking status: Never Smoker  . Smokeless tobacco: Never Used  Substance Use Topics  . Alcohol use: Yes    Comment: occasional  . Drug use: No     Allergies   Patient has no known allergies.   Review of Systems As per HPI   Physical Exam Triage Vital Signs ED Triage Vitals  Enc Vitals Group     BP      Pulse      Resp      Temp  Temp src      SpO2      Weight      Height      Head Circumference      Peak Flow      Pain Score      Pain Loc      Pain Edu?      Excl. in GC?    No data found.  Updated Vital Signs BP (!) 175/101 (BP Location: Left Arm)   Pulse 79   Temp 98.1 F (36.7 C) (Oral)   Resp 20   SpO2 97%   Visual Acuity Right Eye Distance:   Left Eye Distance:   Bilateral Distance:    Right Eye Near:   Left Eye Near:    Bilateral Near:     Physical Exam Constitutional:      General: She is not in acute distress.    Appearance: She is obese. She is not ill-appearing, toxic-appearing or diaphoretic.  HENT:     Head: Normocephalic and atraumatic.     Mouth/Throat:     Mouth: Mucous membranes are moist.     Pharynx: Oropharynx is clear. No oropharyngeal exudate or posterior oropharyngeal erythema.  Eyes:     General: No scleral icterus.    Conjunctiva/sclera:  Conjunctivae normal.     Pupils: Pupils are equal, round, and reactive to light.  Neck:     Comments: Trachea midline, negative JVD Cardiovascular:     Rate and Rhythm: Normal rate and regular rhythm.     Heart sounds: No murmur. No gallop.   Pulmonary:     Effort: Pulmonary effort is normal. No respiratory distress.     Breath sounds: No stridor. No wheezing, rhonchi or rales.     Comments: Decreased breath sounds bilaterally Chest:     Chest wall: No tenderness.  Musculoskeletal:     Cervical back: Normal range of motion and neck supple. No tenderness.  Lymphadenopathy:     Cervical: No cervical adenopathy.  Skin:    General: Skin is warm.     Capillary Refill: Capillary refill takes less than 2 seconds.     Coloration: Skin is not jaundiced or pale.     Findings: No rash.  Neurological:     General: No focal deficit present.     Mental Status: She is alert and oriented to person, place, and time.      UC Treatments / Results  Labs (all labs ordered are listed, but only abnormal results are displayed) Labs Reviewed  NOVEL CORONAVIRUS, NAA    EKG   Radiology No results found.  Procedures Procedures (including critical care time)  Medications Ordered in UC Medications - No data to display  Initial Impression / Assessment and Plan / UC Course  I have reviewed the triage vital signs and the nursing notes.  Pertinent labs & imaging results that were available during my care of the patient were reviewed by me and considered in my medical decision making (see chart for details).     Patient afebrile, nontoxic, with SpO2 97%.  Covid PCR pending.  Patient to quarantine until results are back.  Patient without significant cardiac history, arrhythmias-we will start azithromycin given cough that is worse at night, reported posttussive emesis, increase sputum production.  Patient reports he is infection status post antibiotic use: Diflucan sent.  Return precautions  discussed, patient verbalized understanding and is agreeable to plan. Final Clinical Impressions(s) / UC Diagnoses   Final diagnoses:  URI with cough  and congestion     Discharge Instructions     Your COVID test is pending - it is important to quarantine / isolate at home until your results are back. If you test positive and would like further evaluation for persistent or worsening symptoms, you may schedule an E-visit or virtual (video) visit throughout the [facility] app or website.  PLEASE NOTE: If you develop severe chest pain or shortness of breath please go to the ER or call 9-1-1 for further evaluation --> DO NOT schedule electronic or virtual visits for this. Please call our office for further guidance / recommendations as needed.  For information about the Covid vaccine, please visit [email or web address]    ED Prescriptions    Medication Sig Dispense Auth. Provider   benzonatate (TESSALON) 100 MG capsule Take 1 capsule (100 mg total) by mouth every 8 (eight) hours. 21 capsule Hall-Potvin, [place], PA-C   albuterol (VENTOLIN HFA) 108 (90 Base) MCG/ACT inhaler Inhale 2 puffs into the lungs every 4 (four) hours as needed for wheezing or shortness of breath. 18 g Hall-Potvin, [place], PA-C   Spacer/Aero-Holding Chambers (AEROCHAMBER PLUS FLO-VU MEDIUM) MISC 1 each by Other route once for 1 dose. 1 each Hall-Potvin, [place], PA-C   azithromycin (ZITHROMAX) 250 MG tablet Take 1 tablet (250 mg total) by mouth daily. Take first 2 tablets together, then 1 every day until finished. 6 tablet Hall-Potvin, [place], PA-C   fluconazole (DIFLUCAN) 200 MG tablet Take 1 tablet (200 mg total) by mouth once for 1 dose. May repeat in 72 hours if needed 2 tablet Hall-Potvin, [place], PA-C     PDMP not reviewed this encounter.   Neldon Mc [place], [place] 11/18/19 1927

## 2019-11-19 LAB — NOVEL CORONAVIRUS, NAA: SARS-CoV-2, NAA: NOT DETECTED

## 2019-11-21 ENCOUNTER — Telehealth: Payer: Self-pay

## 2019-11-21 MED ORDER — HYDROCODONE-HOMATROPINE 5-1.5 MG/5ML PO SYRP: 5.0000 mL | ORAL_SOLUTION | Freq: Every day | ORAL | 0 refills | Status: DC

## 2019-11-21 NOTE — Telephone Encounter (Signed)
Patient called back for continued cough not controlled by medication.  She was seen 11/18/2019.  At that time, was tested negative for Covid.  She was given azithromycin, Tessalon, albuterol without much relief.  Continues with cough, with episodes of posttussive emesis.  Will call and Hycodan for further symptomatic management.

## 2019-12-29 ENCOUNTER — Other Ambulatory Visit: Payer: Self-pay

## 2019-12-29 ENCOUNTER — Ambulatory Visit: Payer: Commercial Managed Care - PPO | Attending: Internal Medicine

## 2019-12-29 DIAGNOSIS — Z23 Encounter for immunization: Secondary | ICD-10-CM

## 2019-12-29 NOTE — Progress Notes (Signed)
   Covid-19 Vaccination Clinic  Name:  Megan Wallace    MRN: [id number] DOB: [birth year]  12/29/2019  [name] was observed post Covid-19 immunization for 15 minutes without incident. She was provided with Vaccine Information Sheet and instruction to access the V-Safe system.   [name] was instructed to call 911 with any severe reactions post vaccine: Marland Kitchen Difficulty breathing  . Swelling of face and throat  . A fast heartbeat  . A bad rash all over body  . Dizziness and weakness   Immunizations Administered    Name Date Dose VIS Date Route   Pfizer COVID-19 Vaccine 12/29/2019 10:05 AM 0.3 mL 09/30/2019 Intramuscular   Manufacturer: [employer], [employer]   Lot: [id number]   NDC: [address]-9

## 2020-01-23 ENCOUNTER — Ambulatory Visit: Payer: Commercial Managed Care - PPO | Attending: Internal Medicine

## 2020-01-23 DIAGNOSIS — Z23 Encounter for immunization: Secondary | ICD-10-CM

## 2020-01-23 NOTE — Progress Notes (Signed)
   Covid-19 Vaccination Clinic  Name:  Megan Wallace    MRN: [id number] DOB: 1966-07-29  01/23/2020  [name] was observed post Covid-19 immunization for 15 minutes without incident. She was provided with Vaccine Information Sheet and instruction to access the V-Safe system.   [name] was instructed to call 911 with any severe reactions post vaccine: Marland Kitchen Difficulty breathing  . Swelling of face and throat  . A fast heartbeat  . A bad rash all over body  . Dizziness and weakness   Immunizations Administered    Name Date Dose VIS Date Route   Pfizer COVID-19 Vaccine 01/23/2020  3:19 PM 0.3 mL 09/30/2019 Intramuscular   Manufacturer: [employer], [employer]   Lot: [id number]   NDC: [address]-4

## 2020-02-06 ENCOUNTER — Other Ambulatory Visit: Payer: Self-pay | Admitting: General Practice

## 2020-02-06 DIAGNOSIS — I10 Essential (primary) hypertension: Secondary | ICD-10-CM

## 2020-02-06 NOTE — Telephone Encounter (Signed)
Refill request denied.  This was a short term Rx written by urgent care. Patient also hasn't been seen in 6 months.

## 2020-02-06 NOTE — Telephone Encounter (Signed)
1) Medication(s) Requested (by name): albuterol (VENTOLIN HFA) 108 (90 Base) MCG/ACT inhaler [[id number]]   2) Pharmacy of Choice:  [id number] [place],  - [address]  [address], [place] [address]    Approved medications will be sent to pharmacy, we will reach out to you if there is an issue.  Requests made after 3pm may not be addressed until following business day!

## 2020-02-23 ENCOUNTER — Other Ambulatory Visit: Payer: Self-pay | Admitting: Family Medicine

## 2020-02-23 DIAGNOSIS — I10 Essential (primary) hypertension: Secondary | ICD-10-CM

## 2020-03-01 MED ORDER — LISINOPRIL-HYDROCHLOROTHIAZIDE 10-12.5 MG PO TABS: 1.0000 | ORAL_TABLET | Freq: Every day | ORAL | 0 refills | Status: DC

## 2020-03-01 MED ORDER — ALBUTEROL SULFATE HFA 108 (90 BASE) MCG/ACT IN AERS: 2.0000 | INHALATION_SPRAY | RESPIRATORY_TRACT | 0 refills | Status: AC | PRN

## 2020-03-01 NOTE — Telephone Encounter (Signed)
Pt asking for a one time fill untiill can be seen  1) Medication(s) Requested (by name):albuterol (VENTOLIN HFA) 108 (90 Base) MCG/ACT inhaler [[id number] lisinopril-hydrochlorothiazide (ZESTORETIC) 10-12.5 MG tablet [[id number]   2) Pharmacy of [id number] [place], [place] - [address]  [address], [place] [address]   3) Special Requests:   Approved medications will be sent to the pharmacy, we will reach out if there is an issue.  Requests made after 3pm may not be addressed until the following business day!  If a patient is unsure of the name of the medication(s) please note and ask patient to call back when they are able to provide all info, do not send to responsible party until all information is available!

## 2020-03-13 ENCOUNTER — Telehealth: Payer: Commercial Managed Care - PPO | Admitting: Internal Medicine

## 2020-12-06 ENCOUNTER — Other Ambulatory Visit: Payer: Self-pay | Admitting: Family Medicine

## 2020-12-06 DIAGNOSIS — Z1231 Encounter for screening mammogram for malignant neoplasm of breast: Secondary | ICD-10-CM

## 2021-01-24 ENCOUNTER — Other Ambulatory Visit: Payer: Self-pay

## 2021-01-24 ENCOUNTER — Ambulatory Visit
Admission: RE | Admit: 2021-01-24 | Discharge: 2021-01-24 | Disposition: A | Discharge status: Home / Self Care | Payer: Commercial Managed Care - PPO | Source: Ambulatory Visit | Attending: Family Medicine | Admitting: Family Medicine

## 2021-01-24 DIAGNOSIS — Z1231 Encounter for screening mammogram for malignant neoplasm of breast: Secondary | ICD-10-CM

## 2021-02-01 ENCOUNTER — Other Ambulatory Visit: Payer: Self-pay | Admitting: Nurse Practitioner

## 2021-02-01 DIAGNOSIS — Z9189 Other specified personal risk factors, not elsewhere classified: Secondary | ICD-10-CM

## 2021-03-21 ENCOUNTER — Other Ambulatory Visit: Payer: Commercial Managed Care - PPO

## 2021-03-26 ENCOUNTER — Other Ambulatory Visit: Payer: Self-pay | Admitting: Nurse Practitioner

## 2021-03-26 DIAGNOSIS — Z780 Asymptomatic menopausal state: Secondary | ICD-10-CM

## 2021-03-26 DIAGNOSIS — Z9189 Other specified personal risk factors, not elsewhere classified: Secondary | ICD-10-CM

## 2021-03-29 ENCOUNTER — Ambulatory Visit
Admission: RE | Admit: 2021-03-29 | Discharge: 2021-03-29 | Disposition: A | Discharge status: Home / Self Care | Payer: Commercial Managed Care - PPO | Source: Ambulatory Visit | Attending: Nurse Practitioner | Admitting: Nurse Practitioner

## 2021-03-29 ENCOUNTER — Other Ambulatory Visit: Payer: Self-pay

## 2021-03-29 DIAGNOSIS — Z780 Asymptomatic menopausal state: Secondary | ICD-10-CM

## 2021-03-29 DIAGNOSIS — Z9189 Other specified personal risk factors, not elsewhere classified: Secondary | ICD-10-CM

## 2021-07-25 ENCOUNTER — Other Ambulatory Visit: Payer: Self-pay | Admitting: Nurse Practitioner

## 2021-07-25 DIAGNOSIS — N950 Postmenopausal bleeding: Secondary | ICD-10-CM

## 2021-07-30 ENCOUNTER — Ambulatory Visit
Admission: RE | Admit: 2021-07-30 | Discharge: 2021-07-30 | Disposition: A | Discharge status: Home / Self Care | Payer: Commercial Managed Care - PPO | Source: Ambulatory Visit | Attending: Nurse Practitioner | Admitting: Nurse Practitioner

## 2021-07-30 DIAGNOSIS — N950 Postmenopausal bleeding: Secondary | ICD-10-CM

## 2021-12-19 DIAGNOSIS — I10 Essential (primary) hypertension: Secondary | ICD-10-CM | POA: Diagnosis not present

## 2021-12-19 DIAGNOSIS — R600 Localized edema: Secondary | ICD-10-CM | POA: Diagnosis not present

## 2021-12-19 DIAGNOSIS — L739 Follicular disorder, unspecified: Secondary | ICD-10-CM | POA: Diagnosis not present

## 2021-12-19 DIAGNOSIS — E559 Vitamin D deficiency, unspecified: Secondary | ICD-10-CM | POA: Diagnosis not present

## 2022-01-27 DIAGNOSIS — Z6841 Body Mass Index (BMI) 40.0 and over, adult: Secondary | ICD-10-CM | POA: Diagnosis not present

## 2022-01-27 DIAGNOSIS — G629 Polyneuropathy, unspecified: Secondary | ICD-10-CM | POA: Diagnosis not present

## 2022-01-27 DIAGNOSIS — I10 Essential (primary) hypertension: Secondary | ICD-10-CM | POA: Diagnosis not present

## 2022-01-28 ENCOUNTER — Other Ambulatory Visit: Payer: Self-pay | Admitting: Family Medicine

## 2022-01-28 DIAGNOSIS — Z1231 Encounter for screening mammogram for malignant neoplasm of breast: Secondary | ICD-10-CM

## 2022-01-31 ENCOUNTER — Other Ambulatory Visit: Payer: Self-pay

## 2022-01-31 ENCOUNTER — Encounter: Payer: Self-pay | Admitting: Neurology

## 2022-01-31 DIAGNOSIS — R202 Paresthesia of skin: Secondary | ICD-10-CM

## 2022-02-04 DIAGNOSIS — N644 Mastodynia: Secondary | ICD-10-CM | POA: Diagnosis not present

## 2022-02-04 DIAGNOSIS — A599 Trichomoniasis, unspecified: Secondary | ICD-10-CM | POA: Diagnosis not present

## 2022-02-04 DIAGNOSIS — N898 Other specified noninflammatory disorders of vagina: Secondary | ICD-10-CM | POA: Diagnosis not present

## 2022-02-04 DIAGNOSIS — Z01419 Encounter for gynecological examination (general) (routine) without abnormal findings: Secondary | ICD-10-CM | POA: Diagnosis not present

## 2022-02-06 ENCOUNTER — Ambulatory Visit
Admission: RE | Admit: 2022-02-06 | Discharge: 2022-02-06 | Disposition: A | Discharge status: Home / Self Care | Payer: BC Managed Care – PPO | Source: Ambulatory Visit | Attending: Family Medicine | Admitting: Family Medicine

## 2022-02-06 ENCOUNTER — Other Ambulatory Visit: Payer: Self-pay | Admitting: Family Medicine

## 2022-02-06 ENCOUNTER — Other Ambulatory Visit: Payer: Self-pay | Admitting: Nurse Practitioner

## 2022-02-06 DIAGNOSIS — Z1231 Encounter for screening mammogram for malignant neoplasm of breast: Secondary | ICD-10-CM

## 2022-02-06 DIAGNOSIS — N83202 Unspecified ovarian cyst, left side: Secondary | ICD-10-CM

## 2022-02-06 DIAGNOSIS — N83201 Unspecified ovarian cyst, right side: Secondary | ICD-10-CM

## 2022-02-24 ENCOUNTER — Encounter: Payer: Self-pay | Admitting: Gastroenterology

## 2022-02-27 ENCOUNTER — Other Ambulatory Visit: Payer: BC Managed Care – PPO

## 2022-03-05 ENCOUNTER — Telehealth: Payer: Self-pay

## 2022-03-05 NOTE — Telephone Encounter (Signed)
Lets do OV first RG 

## 2022-03-05 NOTE — Telephone Encounter (Signed)
PV and procedure cancelled; receptionist to call patient and patient to be rescheduled for an OV prior to being scheduled for a procedure per provider request;  ?

## 2022-03-05 NOTE — Telephone Encounter (Signed)
Dr.Gupta- ?This patient's most recent BMI is noted to be 57.  Would you like this patient to be scheduled for an OV or to be scheduled as a direct at [facility] for her screening colon? ?Please advise ?Thank you ?

## 2022-03-06 ENCOUNTER — Encounter: Payer: Self-pay | Admitting: Gastroenterology

## 2022-03-27 ENCOUNTER — Encounter: Payer: BC Managed Care – PPO | Admitting: Gastroenterology

## 2022-04-08 ENCOUNTER — Ambulatory Visit (INDEPENDENT_AMBULATORY_CARE_PROVIDER_SITE_OTHER): Payer: BC Managed Care – PPO | Admitting: Gastroenterology

## 2022-04-08 ENCOUNTER — Encounter: Payer: Self-pay | Admitting: Gastroenterology

## 2022-04-08 VITALS — BP 138/84 | HR 76 | Ht 66.0 in | Wt 366.4 lb

## 2022-04-08 DIAGNOSIS — K219 Gastro-esophageal reflux disease without esophagitis: Secondary | ICD-10-CM | POA: Diagnosis not present

## 2022-04-08 DIAGNOSIS — Z1211 Encounter for screening for malignant neoplasm of colon: Secondary | ICD-10-CM

## 2022-04-08 DIAGNOSIS — K581 Irritable bowel syndrome with constipation: Secondary | ICD-10-CM | POA: Diagnosis not present

## 2022-04-08 MED ORDER — OMEPRAZOLE 20 MG PO CPDR: 20.0000 mg | DELAYED_RELEASE_CAPSULE | Freq: Every day | ORAL | 4 refills | Status: DC

## 2022-04-08 MED ORDER — NA SULFATE-K SULFATE-MG SULF 17.5-3.13-1.6 GM/177ML PO SOLN: 1.0000 | Freq: Once | ORAL | 0 refills | Status: AC

## 2022-04-08 NOTE — Progress Notes (Signed)
Chief Complaint:   Referring Provider:  Moshe Cipro, NP      ASSESSMENT AND PLAN;   #1. Colorectal cancer screening  #2. IBS-C  #3. GERD  Plan: -Omeprazole 20mg  po QD #90, 4RF -Miralax 17g po QD until larger BM, then cut it in half every day. -Colon for CRC screening at [facility] (BMI>50) -Minimize nonsteroidals. -Encouraged her to continue water aerobics and reduce weight.   Discussed risks & benefits of colonoscopy. Risks including rare perforation req laparotomy, bleeding after bx/polypectomy req blood transfusion, rarely missing neoplasms, risks of anesthesia/sedation, rare risk of damage to internal organs. Benefits outweigh the risks. Patient agrees to proceed. All the questions were answered. Pt consents to proceed. HPI:    Megan Wallace is a 56 y.o. female  With OA, GERD, OSA (couldn't tolerate CPAP), morbid obesity (BMI>50)  Here for colorectal cancer screening.  She has longstanding constipation (since birth of her son 40 years ago) with pellet-like stools.  Occasional LLQ pain/discomfort which gets better with BMs.  Associated abdominal bloating which also gets better with BMs.  Has been drinking plenty of water which helps.  She also recently started water aerobics at [facility].   Has been able to lose approximately 30 pounds over the last 3 months.  Denies having any upper GI symptoms except for heartburn without odynophagia or dysphagia.  For her OA, she has been using ibuprofen 600 to 800 mg 3 times a day with meals.  No melena or hematochezia.  No family history of colon cancer or colonic polyps in a first-degree relative.  Wt Readings from Last 3 Encounters:  04/08/22 (!) 366 lb 6 oz (166.2 kg)  08/10/19 (!) 356 lb (161.5 kg)  07/07/19 (!) 360 lb 12.8 oz (163.7 kg)   Past GI work-up: 2015 - neg cologuard  Past Medical History:  Diagnosis Date   Hypertension    Obesity    Sleep apnea     History reviewed. No pertinent surgical  history.  Family History  Problem Relation Age of Onset   Hypertension Mother    Heart disease Mother    Crohn's disease Mother    Cirrhosis Father    Breast cancer Maternal Grandmother    Breast cancer Maternal Aunt    Breast cancer Other    Colon cancer Maternal Uncle    Rectal cancer Neg Hx    Stomach cancer Neg Hx     Social History   Tobacco Use   Smoking status: Former    Types: Cigarettes   Smokeless tobacco: Never   Tobacco comments:    Was social smoker  Vaping Use   Vaping Use: Never used  Substance Use Topics   Alcohol use: Not Currently    Comment: occasional   Drug use: No    Current Outpatient Medications  Medication Sig Dispense Refill   albuterol (VENTOLIN HFA) 108 (90 Base) MCG/ACT inhaler Inhale 2 puffs into the lungs every 4 (four) hours as needed for wheezing or shortness of breath. 18 g 0   Cholecalciferol (VITAMIN D3) 250 MCG (10000 UT) capsule Take 10,000 Units by mouth daily.     furosemide (LASIX) 20 MG tablet Take 20 mg by mouth daily.     Magnesium 500 MG TABS Take 1 tablet by mouth daily.     Potassium 99 MG TABS Take 1 tablet by mouth daily.     valsartan (DIOVAN) 80 MG tablet Take 80 mg by mouth daily.     No current facility-administered  medications for this visit.    No Known Allergies  Review of Systems:  Constitutional: Denies fever, chills, diaphoresis, appetite change and fatigue.  HEENT: Denies photophobia, eye pain, redness, hearing loss, ear pain, congestion, sore throat, rhinorrhea, sneezing, mouth sores, neck pain, neck stiffness and tinnitus.   Respiratory: Denies SOB, DOE, cough, chest tightness,  and wheezing.   Cardiovascular: Denies chest pain, palpitations and leg swelling.  Genitourinary: Denies dysuria, urgency, frequency, hematuria, flank pain and difficulty urinating.  Musculoskeletal: Denies myalgias, back pain, joint swelling, arthralgias and gait problem.  Skin: No rash.  Neurological: Denies dizziness,  seizures, syncope, weakness, light-headedness, numbness and headaches.  Hematological: Denies adenopathy. Easy bruising, personal or family bleeding history  Psychiatric/Behavioral: No anxiety or depression     Physical Exam:    BP 138/84   Pulse 76   Ht 5\' 6"  (1.676 m)   Wt (!) 366 lb 6 oz (166.2 kg)   SpO2 98%   BMI 59.13 kg/m  Wt Readings from Last 3 Encounters:  04/08/22 (!) 366 lb 6 oz (166.2 kg)  08/10/19 (!) 356 lb (161.5 kg)  07/07/19 (!) 360 lb 12.8 oz (163.7 kg)   Constitutional:  Well-developed, in no acute distress. Psychiatric: Normal mood and affect. Behavior is normal. HEENT: Pupils normal.  Conjunctivae are normal. No scleral icterus. Neck supple.  Cardiovascular: Normal rate, regular rhythm. No edema Pulmonary/chest: Effort normal and breath sounds normal. No wheezing, rales or rhonchi. Abdominal: Soft, nondistended. Nontender. Bowel sounds active throughout. There are no masses palpable. No hepatomegaly. Rectal: Deferred Neurological: Alert and oriented to person place and time. Skin: Skin is warm and dry. No rashes noted.  Data Reviewed: I have personally reviewed following labs and imaging studies  CBC:     No data to display          CMP:    Latest Ref Rng & Units 07/07/2019   10:34 AM  CMP  Glucose 65 - 99 mg/dL 07/09/2019   BUN 6 - 24 mg/dL 13   Creatinine 062 - 1.00 mg/dL 3.76   Sodium 2.83 - 151 mmol/L 140   Potassium 3.5 - 5.2 mmol/L 4.4   Chloride 96 - 106 mmol/L 103   CO2 20 - 29 mmol/L 22   Calcium 8.7 - 10.2 mg/dL 9.4   Total Protein 6.0 - 8.5 g/dL 7.0   Total Bilirubin 0.0 - 1.2 mg/dL 0.2   Alkaline Phos 39 - 117 IU/L 90   AST 0 - 40 IU/L 16   ALT 0 - 32 IU/L 23        761, MD 04/08/2022, 3:30 PM  Cc: 04/10/2022, NP

## 2022-04-08 NOTE — Patient Instructions (Addendum)
If you are age 58 or older, your body mass index should be between 23-30. Your Body mass index is 59.13 kg/m. If this is out of the aforementioned range listed, please consider follow up with your Primary Care Provider.  If you are age 32 or younger, your body mass index should be between 19-25. Your Body mass index is 59.13 kg/m. If this is out of the aformentioned range listed, please consider follow up with your Primary Care Provider.   ________________________________________________________  The [facility] providers would like to encourage you to use [facility] to communicate with providers for non-urgent requests or questions.  Due to long hold times on the telephone, sending your provider a message by [facility] may be a faster and more efficient way to get a response.  Please allow 48 business hours for a response.  Please remember that this is for non-urgent requests.  _______________________________________________________  Megan Wallace have been scheduled for a colonoscopy. Please follow written instructions given to you at your visit today.  Please pick up your prep supplies at the pharmacy within the next 1-3 days. If you use inhalers (even only as needed), please bring them with you on the day of your procedure.  We have sent the following medications to your pharmacy for you to pick up at your convenience: Omeprazole Miralax 17g daily until large bm and then use as needed. Suprep  Thank you,  Dr. Lynann Bologna

## 2022-04-29 DIAGNOSIS — M79605 Pain in left leg: Secondary | ICD-10-CM | POA: Diagnosis not present

## 2022-04-29 DIAGNOSIS — I10 Essential (primary) hypertension: Secondary | ICD-10-CM | POA: Diagnosis not present

## 2022-05-12 DIAGNOSIS — A599 Trichomoniasis, unspecified: Secondary | ICD-10-CM | POA: Diagnosis not present

## 2022-06-12 ENCOUNTER — Encounter (HOSPITAL_COMMUNITY): Payer: Self-pay | Admitting: Gastroenterology

## 2022-06-12 NOTE — Progress Notes (Signed)
Attempted to obtain medical history via telephone, unable to reach at this time. HIPAA compliant voicemail message left requesting return call to pre surgical testing department. 

## 2022-06-17 ENCOUNTER — Encounter: Payer: BC Managed Care – PPO | Admitting: Neurology

## 2022-06-18 NOTE — Anesthesia Preprocedure Evaluation (Addendum)
Anesthesia Evaluation  Patient identified by MRN, date of birth, ID band Patient awake    Reviewed: Allergy & Precautions, NPO status , Patient's Chart, lab work & pertinent test results  Airway Mallampati: II       Dental no notable dental hx.    Pulmonary sleep apnea , former smoker,    Pulmonary exam normal        Cardiovascular hypertension, Pt. on medications Normal cardiovascular exam     Neuro/Psych negative psych ROS   GI/Hepatic negative GI ROS, Neg liver ROS,   Endo/Other  Morbid obesity  Renal/GU negative Renal ROS  negative genitourinary   Musculoskeletal negative musculoskeletal ROS (+)   Abdominal (+) + obese,   Peds  Hematology   Anesthesia Other Findings   Reproductive/Obstetrics                            Anesthesia Physical Anesthesia Plan  ASA: 3  Anesthesia Plan: MAC   Post-op Pain Management:    Induction: Intravenous  PONV Risk Score and Plan: 2 and Propofol infusion, TIVA and Treatment may vary due to age or medical condition  Airway Management Planned: Natural Airway, Nasal Cannula and Simple Face Mask  Additional Equipment: None  Intra-op Plan:   Post-operative Plan:   Informed Consent: I have reviewed the patients History and Physical, chart, labs and discussed the procedure including the risks, benefits and alternatives for the proposed anesthesia with the patient or authorized representative who has indicated his/her understanding and acceptance.       Plan Discussed with: CRNA  Anesthesia Plan Comments:        Anesthesia Quick Evaluation

## 2022-06-19 ENCOUNTER — Ambulatory Visit (HOSPITAL_COMMUNITY)
Admission: RE | Admit: 2022-06-19 | Discharge: 2022-06-19 | Disposition: A | Discharge status: Home / Self Care | Payer: BC Managed Care – PPO | Attending: Gastroenterology | Admitting: Gastroenterology

## 2022-06-19 ENCOUNTER — Encounter (HOSPITAL_COMMUNITY): Payer: Self-pay | Admitting: Gastroenterology

## 2022-06-19 ENCOUNTER — Other Ambulatory Visit: Payer: Self-pay

## 2022-06-19 ENCOUNTER — Ambulatory Visit (HOSPITAL_COMMUNITY): Payer: BC Managed Care – PPO | Admitting: Anesthesiology

## 2022-06-19 ENCOUNTER — Encounter (HOSPITAL_COMMUNITY)
Admission: RE | Disposition: A | Discharge status: Home / Self Care | Payer: Self-pay | Source: Home / Self Care | Attending: Gastroenterology

## 2022-06-19 DIAGNOSIS — Z6841 Body Mass Index (BMI) 40.0 and over, adult: Secondary | ICD-10-CM | POA: Insufficient documentation

## 2022-06-19 DIAGNOSIS — Z1211 Encounter for screening for malignant neoplasm of colon: Secondary | ICD-10-CM | POA: Diagnosis not present

## 2022-06-19 DIAGNOSIS — K5730 Diverticulosis of large intestine without perforation or abscess without bleeding: Secondary | ICD-10-CM | POA: Diagnosis not present

## 2022-06-19 DIAGNOSIS — I10 Essential (primary) hypertension: Secondary | ICD-10-CM | POA: Diagnosis not present

## 2022-06-19 DIAGNOSIS — K219 Gastro-esophageal reflux disease without esophagitis: Secondary | ICD-10-CM | POA: Insufficient documentation

## 2022-06-19 DIAGNOSIS — Z1212 Encounter for screening for malignant neoplasm of rectum: Secondary | ICD-10-CM

## 2022-06-19 DIAGNOSIS — K5790 Diverticulosis of intestine, part unspecified, without perforation or abscess without bleeding: Secondary | ICD-10-CM | POA: Diagnosis not present

## 2022-06-19 DIAGNOSIS — Z87891 Personal history of nicotine dependence: Secondary | ICD-10-CM | POA: Insufficient documentation

## 2022-06-19 DIAGNOSIS — K648 Other hemorrhoids: Secondary | ICD-10-CM | POA: Insufficient documentation

## 2022-06-19 DIAGNOSIS — K581 Irritable bowel syndrome with constipation: Secondary | ICD-10-CM | POA: Insufficient documentation

## 2022-06-19 DIAGNOSIS — G4730 Sleep apnea, unspecified: Secondary | ICD-10-CM | POA: Diagnosis not present

## 2022-06-19 DIAGNOSIS — K649 Unspecified hemorrhoids: Secondary | ICD-10-CM | POA: Diagnosis not present

## 2022-06-19 HISTORY — PX: COLONOSCOPY WITH PROPOFOL: SHX5780

## 2022-06-19 SURGERY — COLONOSCOPY WITH PROPOFOL
Anesthesia: Monitor Anesthesia Care

## 2022-06-19 MED ORDER — PROPOFOL 10 MG/ML IV BOLUS
INTRAVENOUS | Status: DC | PRN
  Administered 2022-06-19 (×3): 20 mg via INTRAVENOUS

## 2022-06-19 MED ORDER — PROPOFOL 1000 MG/100ML IV EMUL
INTRAVENOUS | Status: AC
  Filled 2022-06-19: qty 100

## 2022-06-19 MED ORDER — PROPOFOL 500 MG/50ML IV EMUL
INTRAVENOUS | Status: DC | PRN
  Administered 2022-06-19: 130 ug/kg/min via INTRAVENOUS

## 2022-06-19 MED ORDER — LACTATED RINGERS IV SOLN: INTRAVENOUS | Status: DC

## 2022-06-19 MED ORDER — SODIUM CHLORIDE 0.9 % IV SOLN: INTRAVENOUS | Status: DC

## 2022-06-19 SURGICAL SUPPLY — 22 items

## 2022-06-19 NOTE — Discharge Instructions (Signed)
YOU HAD AN ENDOSCOPIC PROCEDURE TODAY: Refer to the procedure report and other information in the discharge instructions given to you for any specific questions about what was found during the examination. If this information does not answer your questions, please call [facility] office at [phone] to clarify.  ° °YOU SHOULD EXPECT: Some feelings of bloating in the abdomen. Passage of more gas than usual. Walking can help get rid of the air that was put into your GI tract during the procedure and reduce the bloating. If you had a lower endoscopy (such as a colonoscopy or flexible sigmoidoscopy) you may notice spotting of blood in your stool or on the toilet paper. Some abdominal soreness may be present for a day or two, also. ° °DIET: Your first meal following the procedure should be a light meal and then it is ok to progress to your normal diet. A half-sandwich or bowl of soup is an example of a good first meal. Heavy or fried foods are harder to digest and may make you feel nauseous or bloated. Drink plenty of fluids but you should avoid alcoholic beverages for 24 hours. If you had a esophageal dilation, please see attached instructions for diet.   ° °ACTIVITY: Your care partner should take you home directly after the procedure. You should plan to take it easy, moving slowly for the rest of the day. You can resume normal activity the day after the procedure however YOU SHOULD NOT DRIVE, use power tools, machinery or perform tasks that involve climbing or major physical exertion for 24 hours (because of the sedation medicines used during the test).  ° °SYMPTOMS TO REPORT IMMEDIATELY: °A gastroenterologist can be reached at any hour. Please call [phone]  for any of the following symptoms:  °Following lower endoscopy (colonoscopy, flexible sigmoidoscopy) °Excessive amounts of blood in the stool  °Significant tenderness, worsening of abdominal pains  °Swelling of the abdomen that is new, acute  °Fever of 100° or  higher  °Following upper endoscopy (EGD, EUS, ERCP, esophageal dilation) °Vomiting of blood or coffee ground material  °New, significant abdominal pain  °New, significant chest pain or pain under the shoulder blades  °Painful or persistently difficult swallowing  °New shortness of breath  °Black, tarry-looking or red, bloody stools ° °FOLLOW UP:  °If any biopsies were taken you will be contacted by phone or by letter within the next 1-3 weeks. Call [phone]  if you have not heard about the biopsies in 3 weeks.  °Please also call with any specific questions about appointments or follow up tests. ° °

## 2022-06-19 NOTE — Op Note (Signed)
[facility] Patient Name: Megan Wallace Procedure Date: 06/19/2022 MRN: [id number] Attending MD: Lynann Bologna , MD Date of Birth: 10/05/1966 CSN: [id number] Age: 56 Admit Type: Outpatient Procedure:                Colonoscopy Indications:              Screening for colorectal malignant neoplasm Providers:                Lynann Bologna, MD, Marge Duncans, RN, Priscella Mann,                            Technician Referring MD:             Moshe Cipro NP Medicines:                Monitored Anesthesia Care Complications:            No immediate complications. Estimated Blood Loss:     Estimated blood loss: none. Procedure:                Pre-Anesthesia Assessment:                           - Prior to the procedure, a History and Physical                            was performed, and patient medications and                            allergies were reviewed. The patient's tolerance of                            previous anesthesia was also reviewed. The risks                            and benefits of the procedure and the sedation                            options and risks were discussed with the patient.                            All questions were answered, and informed consent                            was obtained. Prior Anticoagulants: The patient has                            taken no previous anticoagulant or antiplatelet                            agents. ASA Grade Assessment: II - A patient with                            mild systemic disease. After reviewing the risks  and benefits, the patient was deemed in                            satisfactory condition to undergo the procedure.                           After obtaining informed consent, the colonoscope                            was passed under direct vision. Throughout the                            procedure, the patient's blood pressure, pulse, and                             oxygen saturations were monitored continuously. The                            CF-HQ190L ([id number]) Olympus colonoscope was                            introduced through the anus and advanced to the the                            cecum, identified by appendiceal orifice and                            ileocecal valve. The colonoscopy was performed                            without difficulty. The patient tolerated the                            procedure well. The quality of the bowel                            preparation was good. The ileocecal valve,                            appendiceal orifice, and rectum were photographed. Scope In: 10:11:53 AM Scope Out: 10:25:51 AM Scope Withdrawal Time: 0 hours 10 minutes 16 seconds  Total Procedure Duration: 0 hours 13 minutes 58 seconds  Findings:      A few medium-mouthed diverticula were found in the sigmoid colon and       rare (1-2) in ascending colon.      Non-bleeding internal hemorrhoids were found during retroflexion. The       hemorrhoids were moderate.      The exam was otherwise without abnormality on direct and retroflexion       views. Impression:               - Mild pancolonic diverticulosis.                           - Non-bleeding internal hemorrhoids.                           -  The examination was otherwise normal on direct                            and retroflexion views.                           - No specimens collected. Moderate Sedation:      Not Applicable - Patient had care per Anesthesia. Recommendation:           - Patient has a contact number available for                            emergencies. The signs and symptoms of potential                            delayed complications were discussed with the                            patient. Return to normal activities tomorrow.                            Written discharge instructions were provided to the                            patient.                            - Resume previous diet.                           - Continue present medications including MiraLAX.                           - Repeat colonoscopy in 10 years for screening                            purposes. Earlier, if with any new problems or                            change in family history.                           - If any problems with hemorrhoids, Preparation H 1                            twice daily after the bowel movement for 7 to 10                            days.                           - The findings and recommendations were discussed                            with the designated responsible adult. Procedure Code(s):        ---  Professional ---                           Q6834, Colorectal cancer screening; colonoscopy on                            individual not meeting criteria for high risk Diagnosis Code(s):        --- Professional ---                           Z12.11, Encounter for screening for malignant                            neoplasm of colon                           K64.8, Other hemorrhoids                           K57.30, Diverticulosis of large intestine without                            perforation or abscess without bleeding CPT copyright 2019 [employer]. All rights reserved. The codes documented in this report are preliminary and upon coder review may  be revised to meet current compliance requirements. Lynann Bologna, MD 06/19/2022 10:34:07 AM This report has been signed electronically. Number of Addenda: 0

## 2022-06-19 NOTE — H&P (Signed)
Chief Complaint:    Referring Provider:  Moshe Cipro, NP        ASSESSMENT AND PLAN;    #1. Colorectal cancer screening   #2. IBS-C   #3. GERD   Plan: -Omeprazole 20mg  po QD #90, 4RF -Miralax 17g po QD until larger BM, then cut it in half every day. -Colon for CRC screening at [facility] (BMI>50) -Minimize nonsteroidals. -Encouraged her to continue water aerobics and reduce weight.     Discussed risks & benefits of colonoscopy. Risks including rare perforation req laparotomy, bleeding after bx/polypectomy req blood transfusion, rarely missing neoplasms, risks of anesthesia/sedation, rare risk of damage to internal organs. Benefits outweigh the risks. Patient agrees to proceed. All the questions were answered. Pt consents to proceed. HPI:     Megan Wallace is a 56 y.o. female  With OA, GERD, OSA (couldn't tolerate CPAP), morbid obesity (BMI>50)   Here for colorectal cancer screening.   She has longstanding constipation (since birth of her son 40 years ago) with pellet-like stools.  Occasional LLQ pain/discomfort which gets better with BMs.  Associated abdominal bloating which also gets better with BMs.  Has been drinking plenty of water which helps.  She also recently started water aerobics at [facility].    Has been able to lose approximately 30 pounds over the last 3 months.   Denies having any upper GI symptoms except for heartburn without odynophagia or dysphagia.   For her OA, she has been using ibuprofen 600 to 800 mg 3 times a day with meals.   No melena or hematochezia.   No family history of colon cancer or colonic polyps in a first-degree relative.      Wt Readings from Last 3 Encounters:  04/08/22 (!) 366 lb 6 oz (166.2 kg)  08/10/19 (!) 356 lb (161.5 kg)  07/07/19 (!) 360 lb 12.8 oz (163.7 kg)    Past GI work-up: 2015 - neg cologuard       Past Medical History:  Diagnosis Date   Hypertension     Obesity     Sleep apnea        History  reviewed. No pertinent surgical history.        Family History  Problem Relation Age of Onset   Hypertension Mother     Heart disease Mother     Crohn's disease Mother     Cirrhosis Father     Breast cancer Maternal Grandmother     Breast cancer Maternal Aunt     Breast cancer Other     Colon cancer Maternal Uncle     Rectal cancer Neg Hx     Stomach cancer Neg Hx        Social History         Tobacco Use   Smoking status: Former      Types: Cigarettes   Smokeless tobacco: Never   Tobacco comments:      Was social smoker  Vaping Use   Vaping Use: Never used  Substance Use Topics   Alcohol use: Not Currently      Comment: occasional   Drug use: No            Current Outpatient Medications  Medication Sig Dispense Refill   albuterol (VENTOLIN HFA) 108 (90 Base) MCG/ACT inhaler Inhale 2 puffs into the lungs every 4 (four) hours as needed for wheezing or shortness of breath. 18 g 0   Cholecalciferol (VITAMIN D3) 250 MCG (10000  UT) capsule Take 10,000 Units by mouth daily.       furosemide (LASIX) 20 MG tablet Take 20 mg by mouth daily.       Magnesium 500 MG TABS Take 1 tablet by mouth daily.       Potassium 99 MG TABS Take 1 tablet by mouth daily.       valsartan (DIOVAN) 80 MG tablet Take 80 mg by mouth daily.        No current facility-administered medications for this visit.      No Known Allergies   Review of Systems:  Constitutional: Denies fever, chills, diaphoresis, appetite change and fatigue.  HEENT: Denies photophobia, eye pain, redness, hearing loss, ear pain, congestion, sore throat, rhinorrhea, sneezing, mouth sores, neck pain, neck stiffness and tinnitus.   Respiratory: Denies SOB, DOE, cough, chest tightness,  and wheezing.   Cardiovascular: Denies chest pain, palpitations and leg swelling.  Genitourinary: Denies dysuria, urgency, frequency, hematuria, flank pain and difficulty urinating.  Musculoskeletal: Denies myalgias, back pain, joint swelling,  arthralgias and gait problem.  Skin: No rash.  Neurological: Denies dizziness, seizures, syncope, weakness, light-headedness, numbness and headaches.  Hematological: Denies adenopathy. Easy bruising, personal or family bleeding history  Psychiatric/Behavioral: No anxiety or depression       Physical Exam:     BP 138/84   Pulse 76   Ht 5\' 6"  (1.676 m)   Wt (!) 366 lb 6 oz (166.2 kg)   SpO2 98%   BMI 59.13 kg/m     Wt Readings from Last 3 Encounters:  04/08/22 (!) 366 lb 6 oz (166.2 kg)  08/10/19 (!) 356 lb (161.5 kg)  07/07/19 (!) 360 lb 12.8 oz (163.7 kg)    Constitutional:  Well-developed, in no acute distress. Psychiatric: Normal mood and affect. Behavior is normal. HEENT: Pupils normal.  Conjunctivae are normal. No scleral icterus. Neck supple.  Cardiovascular: Normal rate, regular rhythm. No edema Pulmonary/chest: Effort normal and breath sounds normal. No wheezing, rales or rhonchi. Abdominal: Soft, nondistended. Nontender. Bowel sounds active throughout. There are no masses palpable. No hepatomegaly. Rectal: Deferred Neurological: Alert and oriented to person place and time. Skin: Skin is warm and dry. No rashes noted.   Data Reviewed: I have personally reviewed following labs and imaging studies   CBC:       No data to display             CMP:     Latest Ref Rng & Units 07/07/2019   10:34 AM  CMP  Glucose 65 - 99 mg/dL 07/09/2019   BUN 6 - 24 mg/dL 13   Creatinine 382 - 1.00 mg/dL 5.05   Sodium 3.97 - 673 mmol/L 140   Potassium 3.5 - 5.2 mmol/L 4.4   Chloride 96 - 106 mmol/L 103   CO2 20 - 29 mmol/L 22   Calcium 8.7 - 10.2 mg/dL 9.4   Total Protein 6.0 - 8.5 g/dL 7.0   Total Bilirubin 0.0 - 1.2 mg/dL 0.2   Alkaline Phos 39 - 117 IU/L 90   AST 0 - 40 IU/L 16   ALT 0 - 32 IU/L 23             419, MD

## 2022-06-19 NOTE — Transfer of Care (Signed)
Immediate Anesthesia Transfer of Care Note  Patient: Megan Wallace  Procedure(s) Performed: COLONOSCOPY WITH PROPOFOL  Patient Location: PACU  Anesthesia Type:MAC  Level of Consciousness: sedated  Airway & Oxygen Therapy: Patient Spontanous Breathing and Patient connected to face mask oxygen  Post-op Assessment: Report given to RN and Post -op Vital signs reviewed and stable  Post vital signs: Reviewed and stable  Last Vitals:  Vitals Value Taken Time  BP 123/50 06/19/22 1034  Temp 36.5 C 06/19/22 1034  Pulse 81 06/19/22 1036  Resp 16 06/19/22 1036  SpO2 100 % 06/19/22 1036  Vitals shown include unvalidated device data.  Last Pain:  Vitals:   06/19/22 1034  TempSrc: Tympanic  PainSc: Asleep         Complications: No notable events documented.

## 2022-06-20 NOTE — Anesthesia Postprocedure Evaluation (Signed)
Anesthesia Post Note  Patient: Megan Wallace  Procedure(s) Performed: COLONOSCOPY WITH PROPOFOL     Patient location during evaluation: Endoscopy Anesthesia Type: MAC Level of consciousness: awake Pain management: pain level controlled Vital Signs Assessment: post-procedure vital signs reviewed and stable Respiratory status: spontaneous breathing Cardiovascular status: stable Postop Assessment: no apparent nausea or vomiting Anesthetic complications: no   No notable events documented.  Last Vitals:  Vitals:   06/19/22 1044 06/19/22 1054  BP: (!) 102/52 123/66  Pulse: 70 75  Resp: (!) 21 20  Temp:    SpO2: 98% 100%    Last Pain:  Vitals:   06/19/22 1054  TempSrc:   PainSc: 0-No pain                 Huston Foley

## 2022-06-22 ENCOUNTER — Encounter (HOSPITAL_COMMUNITY): Payer: Self-pay | Admitting: Gastroenterology

## 2022-07-29 ENCOUNTER — Encounter: Payer: BC Managed Care – PPO | Admitting: Neurology

## 2022-08-28 ENCOUNTER — Encounter: Payer: BC Managed Care – PPO | Admitting: Neurology

## 2022-09-18 DIAGNOSIS — H35033 Hypertensive retinopathy, bilateral: Secondary | ICD-10-CM | POA: Diagnosis not present

## 2022-09-18 DIAGNOSIS — H4710 Unspecified papilledema: Secondary | ICD-10-CM | POA: Diagnosis not present

## 2022-11-04 DIAGNOSIS — E8881 Metabolic syndrome: Secondary | ICD-10-CM | POA: Diagnosis not present

## 2022-11-04 DIAGNOSIS — E559 Vitamin D deficiency, unspecified: Secondary | ICD-10-CM | POA: Diagnosis not present

## 2022-11-04 DIAGNOSIS — Z79899 Other long term (current) drug therapy: Secondary | ICD-10-CM | POA: Diagnosis not present

## 2022-11-04 DIAGNOSIS — Z23 Encounter for immunization: Secondary | ICD-10-CM | POA: Diagnosis not present

## 2022-11-04 DIAGNOSIS — Z0000 Encounter for general adult medical examination without abnormal findings: Secondary | ICD-10-CM | POA: Diagnosis not present

## 2023-03-02 ENCOUNTER — Ambulatory Visit
Admission: RE | Admit: 2023-03-02 | Discharge: 2023-03-02 | Disposition: A | Discharge status: Home / Self Care | Payer: BC Managed Care – PPO | Source: Ambulatory Visit | Attending: Family Medicine | Admitting: Family Medicine

## 2023-03-02 ENCOUNTER — Other Ambulatory Visit: Payer: Self-pay | Admitting: Family Medicine

## 2023-03-02 DIAGNOSIS — Z1231 Encounter for screening mammogram for malignant neoplasm of breast: Secondary | ICD-10-CM | POA: Diagnosis not present

## 2023-04-29 DIAGNOSIS — M79671 Pain in right foot: Secondary | ICD-10-CM | POA: Diagnosis not present

## 2023-04-29 DIAGNOSIS — M67911 Unspecified disorder of synovium and tendon, right shoulder: Secondary | ICD-10-CM | POA: Diagnosis not present

## 2023-04-29 DIAGNOSIS — M4722 Other spondylosis with radiculopathy, cervical region: Secondary | ICD-10-CM | POA: Diagnosis not present

## 2023-05-16 ENCOUNTER — Other Ambulatory Visit: Payer: Self-pay | Admitting: Gastroenterology

## 2023-08-11 DIAGNOSIS — Z713 Dietary counseling and surveillance: Secondary | ICD-10-CM | POA: Diagnosis not present

## 2023-08-11 DIAGNOSIS — I10 Essential (primary) hypertension: Secondary | ICD-10-CM | POA: Diagnosis not present

## 2023-08-11 DIAGNOSIS — Z6841 Body Mass Index (BMI) 40.0 and over, adult: Secondary | ICD-10-CM | POA: Diagnosis not present

## 2024-01-19 ENCOUNTER — Other Ambulatory Visit: Payer: Self-pay | Admitting: Family Medicine

## 2024-01-19 DIAGNOSIS — Z1231 Encounter for screening mammogram for malignant neoplasm of breast: Secondary | ICD-10-CM

## 2024-02-21 IMAGING — MG MM DIGITAL SCREENING BILAT W/ TOMO AND CAD
6 of 10 series · 6 of 30 positions shown · non-contrast
Comparison: Previous exam(s).

ACR Breast Density Category a: The breast tissue is almost entirely
fatty.

CLINICAL DATA: Screening.

EXAM:
DIGITAL SCREENING BILATERAL MAMMOGRAM WITH TOMOSYNTHESIS AND CAD
TECHNIQUE: Bilateral screening digital craniocaudal and mediolateral oblique
mammograms were obtained. Bilateral screening digital breast
tomosynthesis was performed. The images were evaluated with
computer-aided detection.

[L MLO synth-2D]
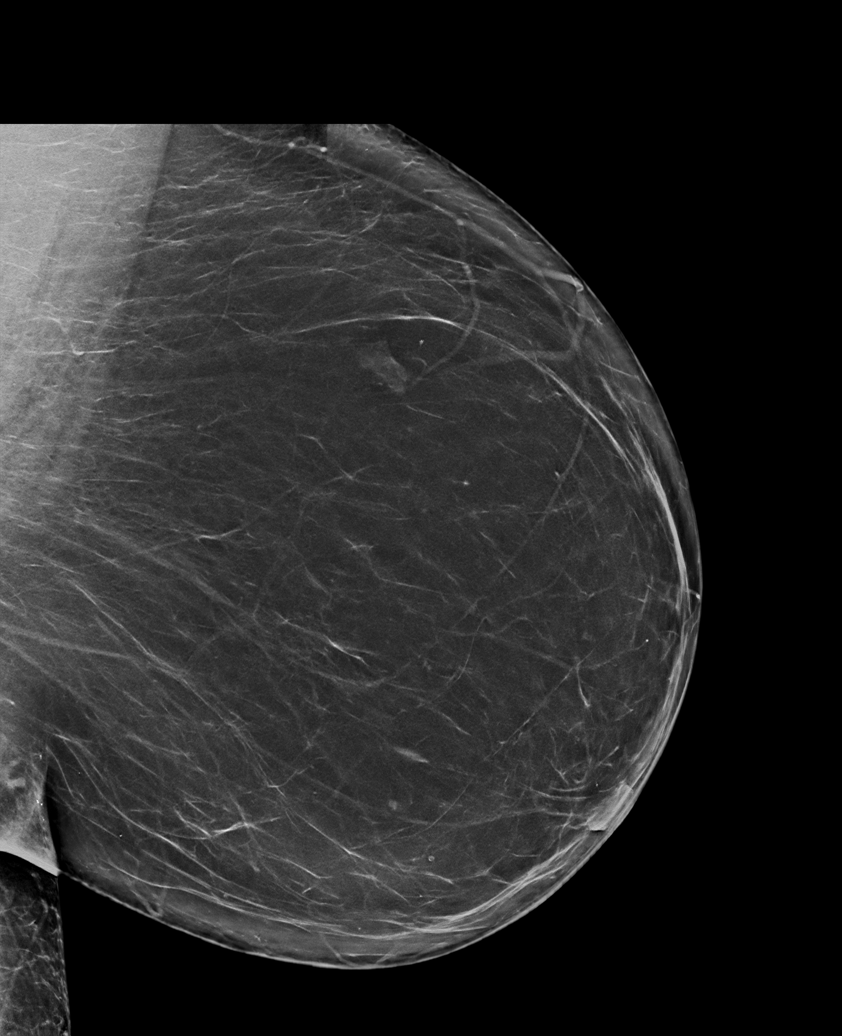

[L CC synth-2D]
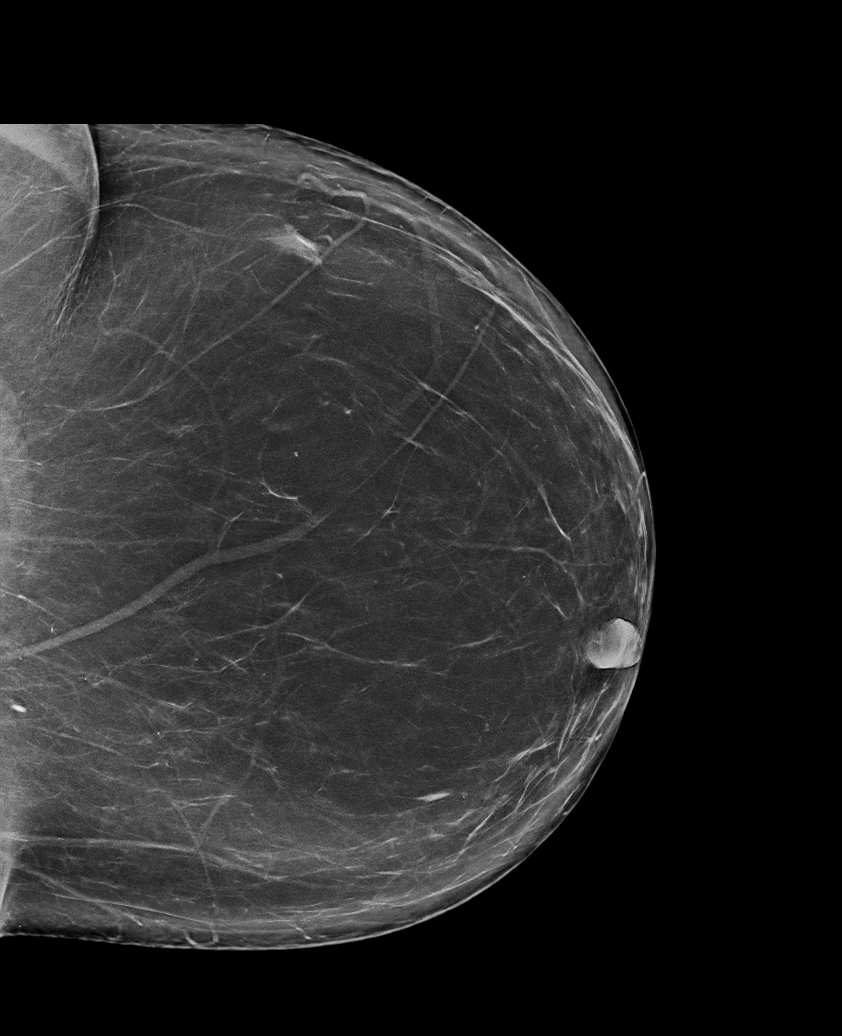

[R CC synth-2D]
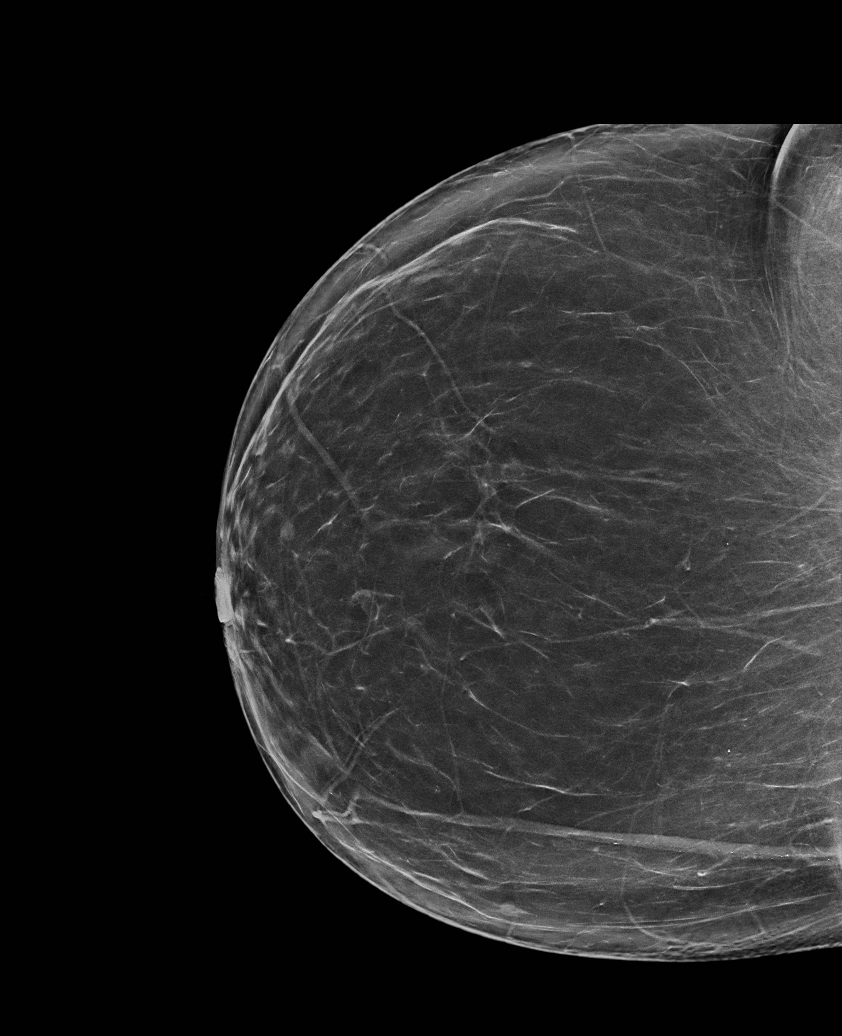

[R MLO synth-2D (1 of 2)]
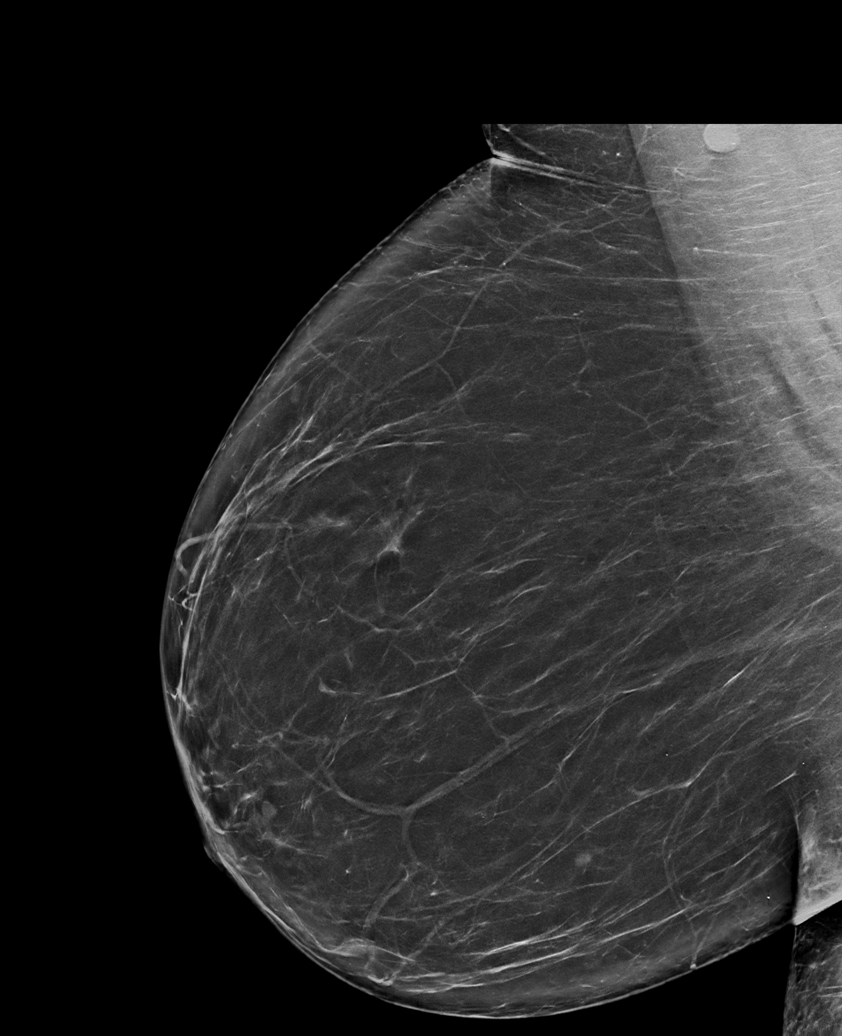

[R MLO synth-2D (2 of 2)]
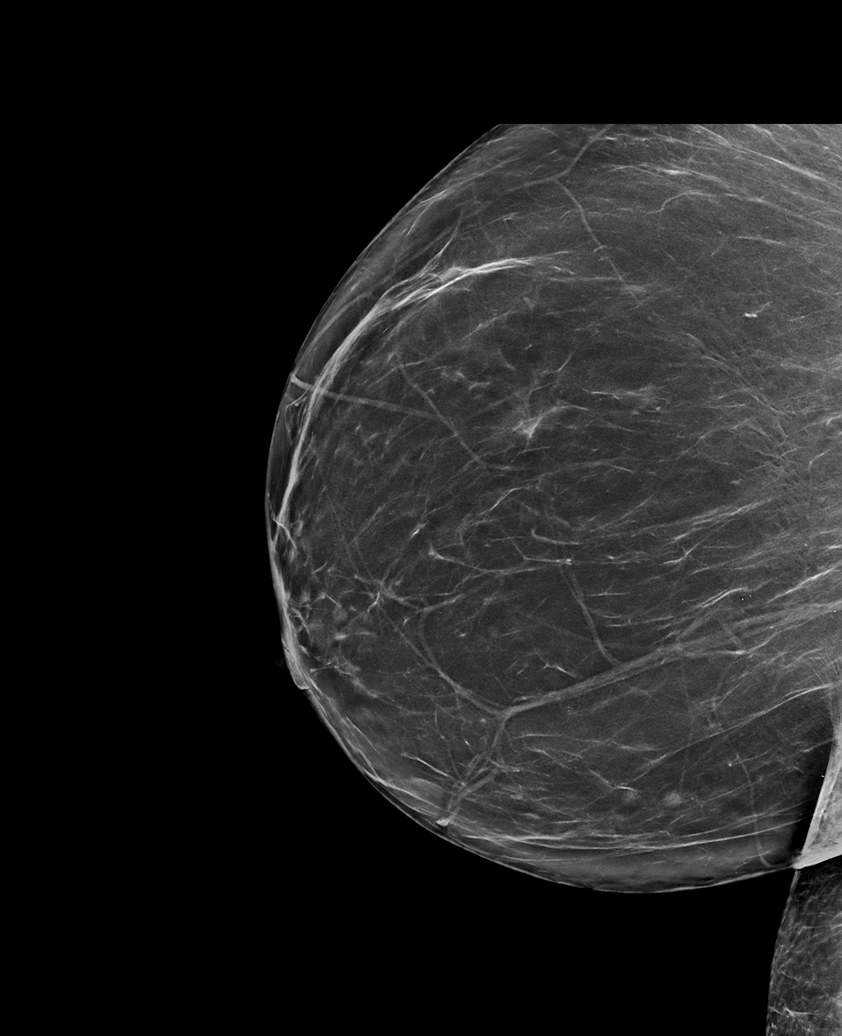

[R CC tomo · tomo slice 43/84.0]
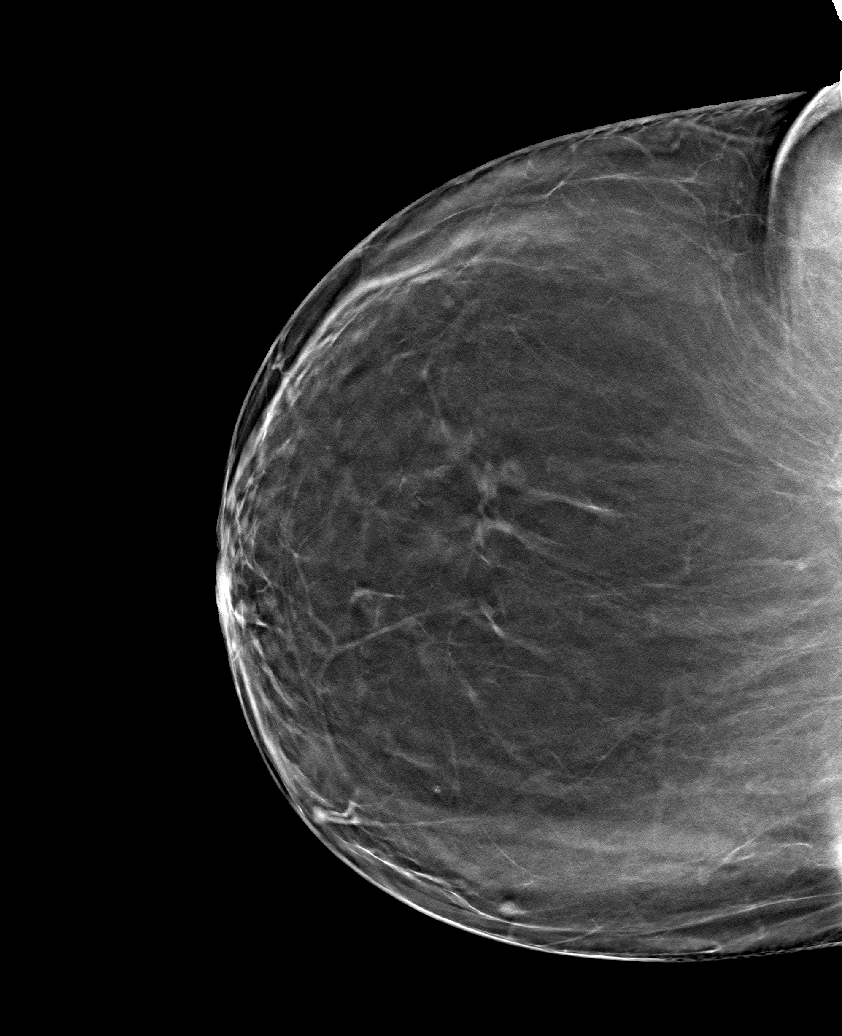

[6 of 30 positions shown; findings below may reference images not displayed]

FINDINGS: There are no findings suspicious for malignancy.
IMPRESSION: No mammographic evidence of malignancy. A result letter of this
screening mammogram will be mailed directly to the patient.

RECOMMENDATION:
Screening mammogram in one year. (Code:0E-3-N98)

BI-RADS CATEGORY  1: Negative.

## 2024-02-24 ENCOUNTER — Encounter (HOSPITAL_COMMUNITY): Payer: Self-pay

## 2024-03-02 ENCOUNTER — Ambulatory Visit
Admission: RE | Admit: 2024-03-02 | Discharge: 2024-03-02 | Disposition: A | Discharge status: Home / Self Care | Source: Ambulatory Visit | Attending: Family Medicine | Admitting: Family Medicine

## 2024-03-02 DIAGNOSIS — Z1231 Encounter for screening mammogram for malignant neoplasm of breast: Secondary | ICD-10-CM | POA: Diagnosis not present

## 2024-05-16 DIAGNOSIS — Z130 Encounter for screening for diseases of the blood and blood-forming organs and certain disorders involving the immune mechanism: Secondary | ICD-10-CM | POA: Diagnosis not present

## 2024-05-16 DIAGNOSIS — I10 Essential (primary) hypertension: Secondary | ICD-10-CM | POA: Diagnosis not present

## 2024-05-16 DIAGNOSIS — Z713 Dietary counseling and surveillance: Secondary | ICD-10-CM | POA: Diagnosis not present

## 2024-05-16 DIAGNOSIS — Z0000 Encounter for general adult medical examination without abnormal findings: Secondary | ICD-10-CM | POA: Diagnosis not present

## 2024-05-16 DIAGNOSIS — Z6841 Body Mass Index (BMI) 40.0 and over, adult: Secondary | ICD-10-CM | POA: Diagnosis not present

## 2024-08-06 NOTE — Progress Notes (Signed)
 PT SCREENED FOR HTN, BS and SDOH needs. BP taken times two. Educated pt on risk of HTN. Healthy eating habits. Encouraged pt to follow up with PCP next week. Screened by A. Edwards RMA

## 2024-09-27 NOTE — Progress Notes (Signed)
 Pt attended 08/06/2024 screening event.  Per chart review pt does have a PCP Randa Sharps; [facility] [place]), insurance, and is not a smoker. Pt's last appt with PCP was 05/16/2024. Pt does not indicate any SDOH needs at this time.  No additional pt f/u to be scheduled at this time per health equity protocol.
